# Patient Record
Sex: Male | Born: 1935 | Race: Black or African American | Hispanic: No | Marital: Single
Health system: Southern US, Community
[De-identification: ages and names within clinical notes are randomized; demographics above are authoritative.]

---

## 2003-09-16 ENCOUNTER — Other Ambulatory Visit: Payer: Self-pay

## 2007-01-10 ENCOUNTER — Inpatient Hospital Stay: Payer: Self-pay | Admitting: General Surgery

## 2007-01-10 ENCOUNTER — Other Ambulatory Visit: Payer: Self-pay

## 2007-02-20 ENCOUNTER — Inpatient Hospital Stay: Payer: Self-pay | Admitting: General Surgery

## 2007-02-20 ENCOUNTER — Other Ambulatory Visit: Payer: Self-pay

## 2007-02-26 ENCOUNTER — Other Ambulatory Visit: Payer: Self-pay

## 2007-09-01 IMAGING — CR DG CHEST 1V
1 series · 1 of 1 positions shown · non-contrast
Comparison: none

REASON FOR EXAM: CHEST PAIN
COMMENTS:

[view not recorded]
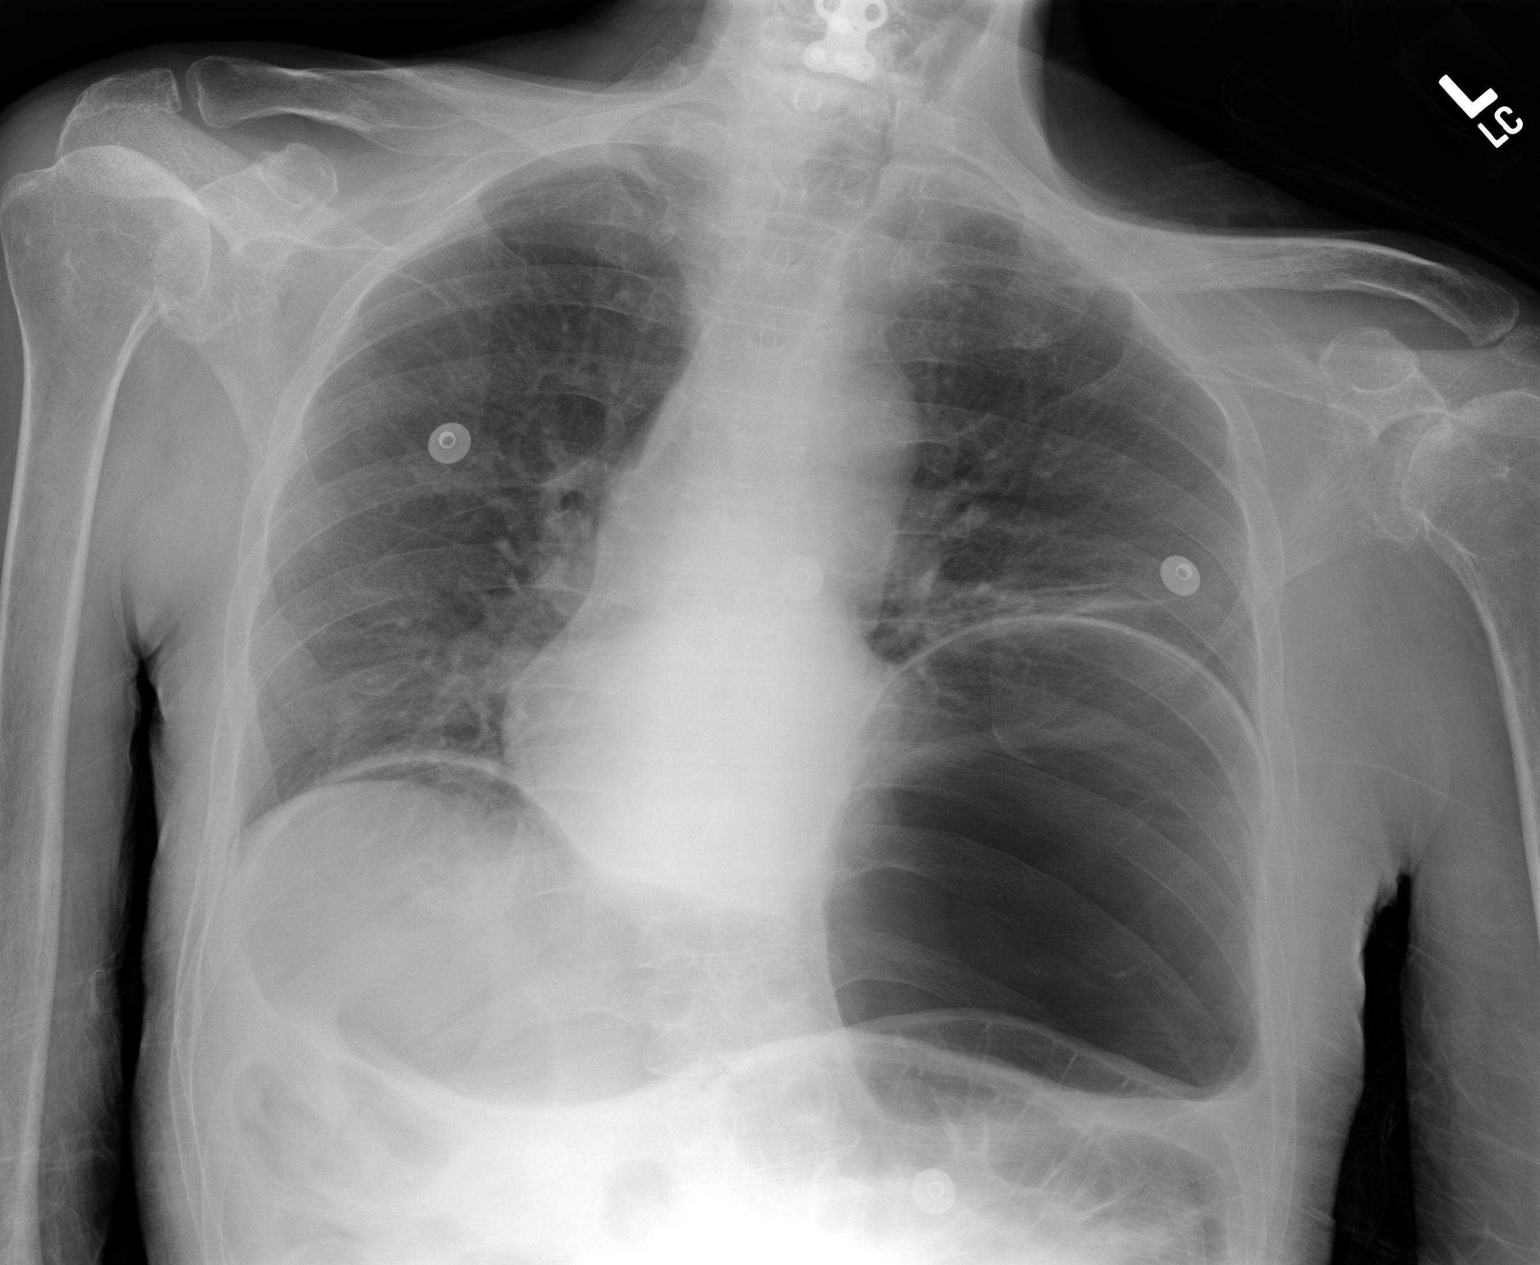

[1 of 1 positions shown; findings below may reference images not displayed]

PROCEDURE:     DXR - DXR CHEST 1 VIEWAP OR PA  - February 20, 2007 [DATE]

RESULT:       Comparison is made to a prior study dated 01/27/07.

The patient has taken a shallow inspiration. There is marked dilation of the
stomach which appears to be air-filled. Air-filled loops of large and small
bowel are appreciated. When compared to a previous study dated 01/27/2007,
these findings were appreciated at that time. No new focal regions of
consolidation are appreciated. The LEFT sided central venous catheter has
been removed in the interim.
IMPRESSION: Shallow inspiration with air-filled dilated stomach and distended air-filled
loops of large and small bowel. No new focal regions of consolidation are
appreciated. An area of increased density projects within the LEFT lung base
not mentioned above which has a linear pattern and likely represents
vascular crowding due to the dilated stomach. Surveillance evaluation
recommended if and as clinically warranted considering the patient's
history.

## 2007-09-01 IMAGING — CR DG ABDOMEN 2V
1 series · 3 of 3 positions shown · non-contrast
Comparison: none

REASON FOR EXAM: abd pain
COMMENTS:

[Series 1: view not recorded · 0.17mm/px · 3 of 3 slices shown]
[im 1/3]
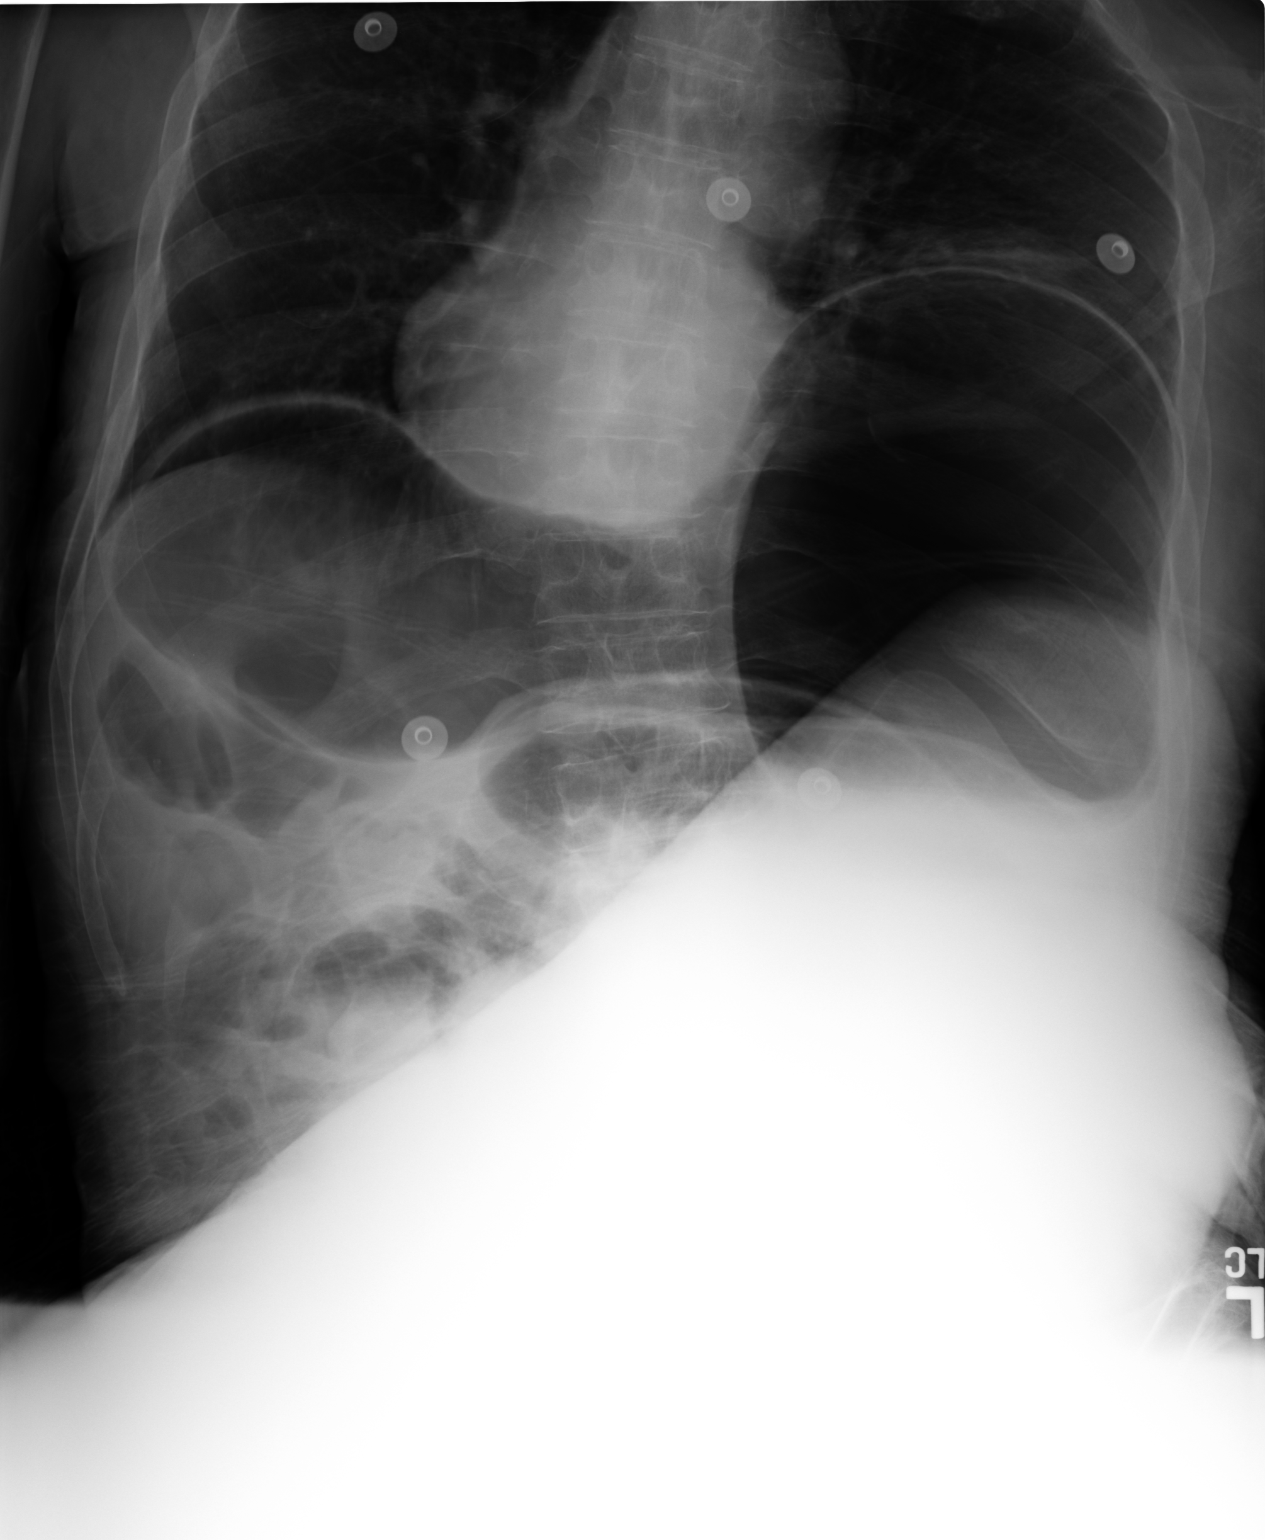
[im 2/3]
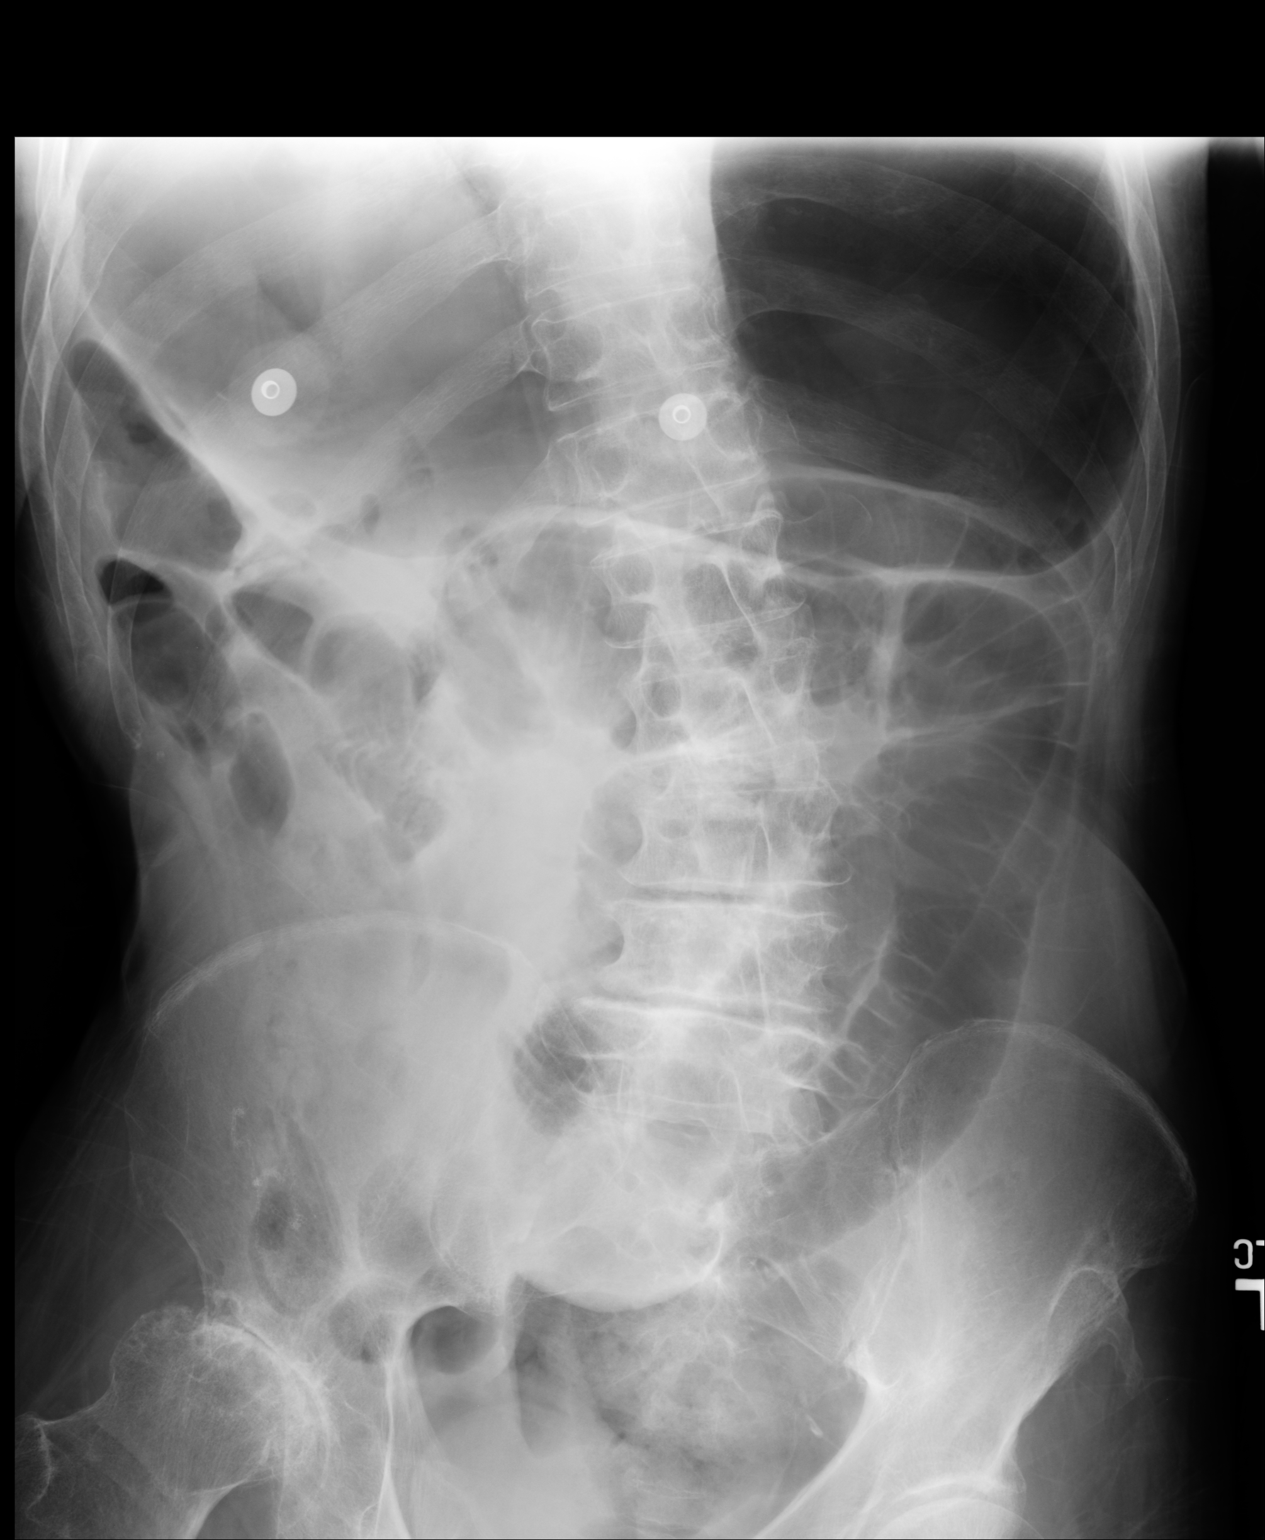
[im 3/3]
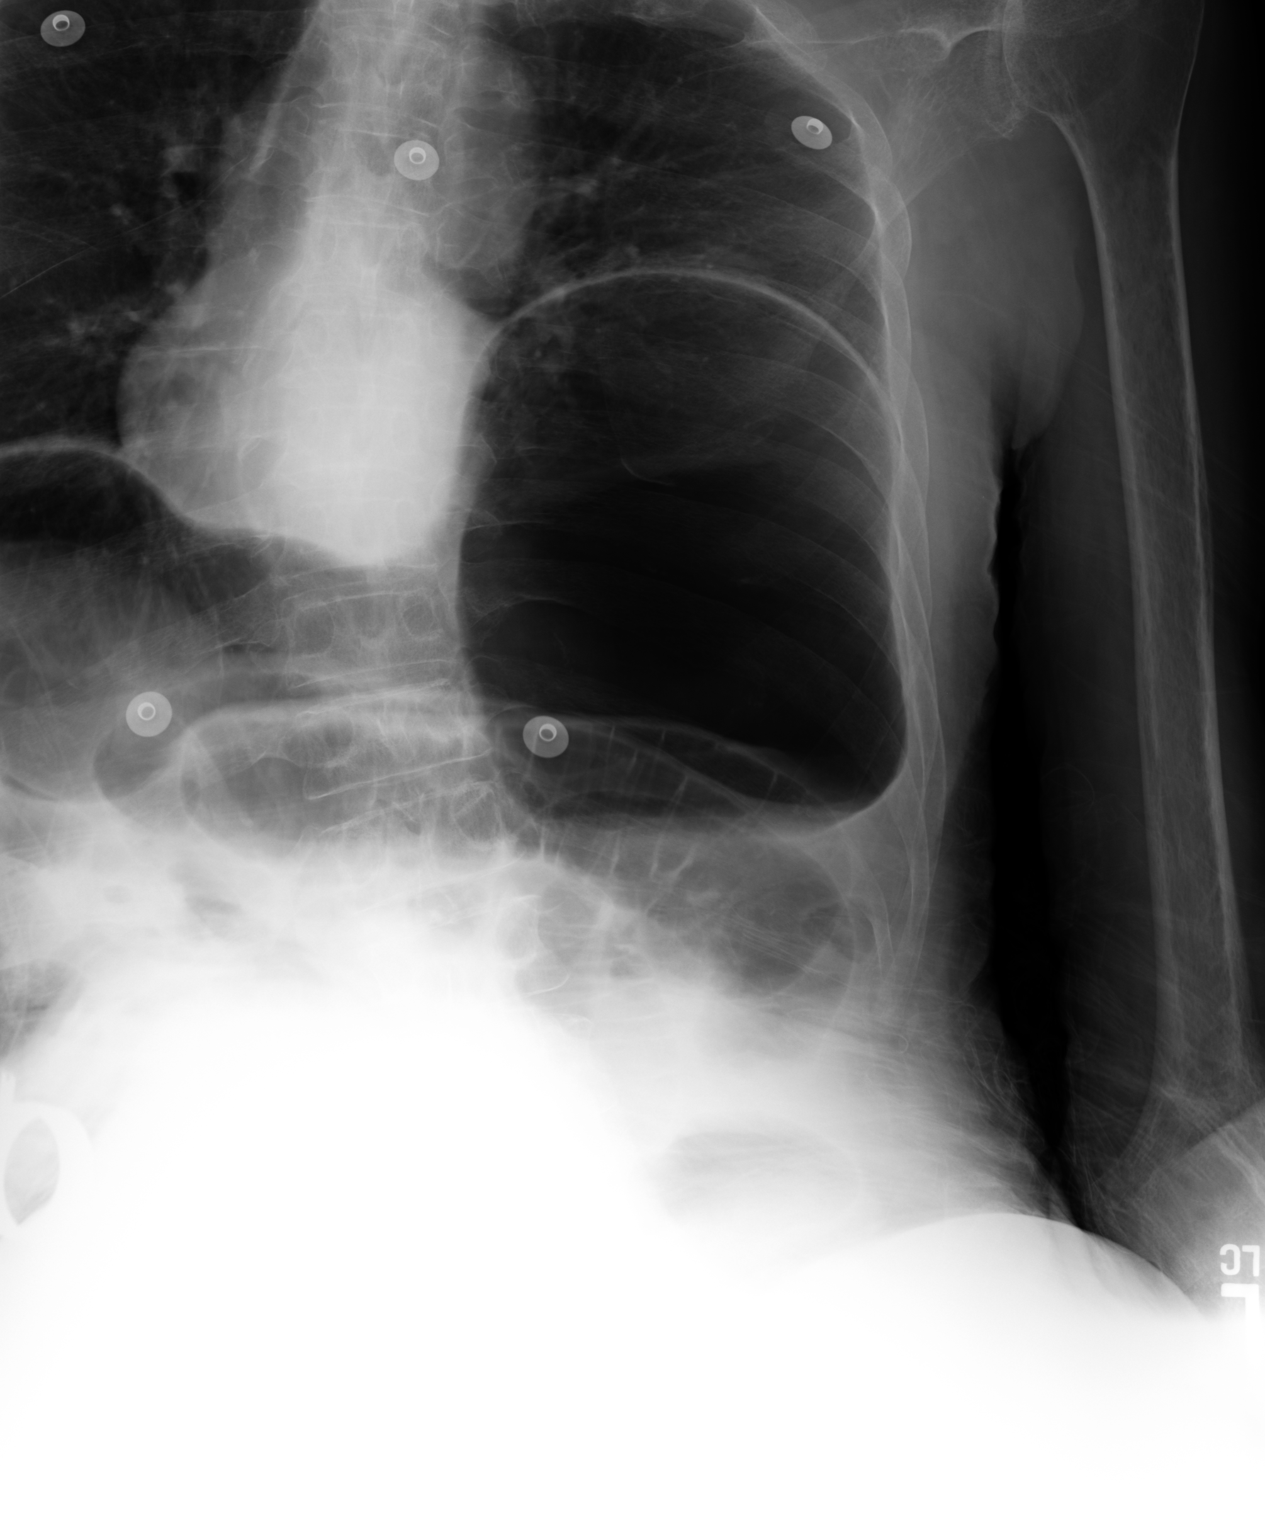

[3 of 3 positions shown; findings below may reference images not displayed]

PROCEDURE:     DXR - DXR ABDOMEN 2 V FLAT AND ERECT  - February 20, 2007 [DATE]

RESULT:     The upright view is obscured.

Dilated loops of small bowel are appreciated as well as a dilated air-filled
stomach. Air is appreciated within loops of large bowel. The visualized bony
skeleton is osteopenic. The thoracolumbar spine demonstrates levoscoliosis.
Degenerative changes are appreciated involving the RIGHT and LEFT hips.
IMPRESSION: Findings which appear to reflect the sequela of a small bowel obstruction
versus an ileus. This may be an early or partial obstruction though
considering the findings in the stomach an ileus is a diagnostic
consideration. Surveillance evaluation is recommended.

## 2007-09-05 IMAGING — CR DG ABDOMEN 2V
1 series · 2 of 2 positions shown · non-contrast
Comparison: none

REASON FOR EXAM: F/U abdominal distension.
COMMENTS:

[Series 1: view not recorded · 0.17mm/px · 2 of 2 slices shown]
[im 1/2]
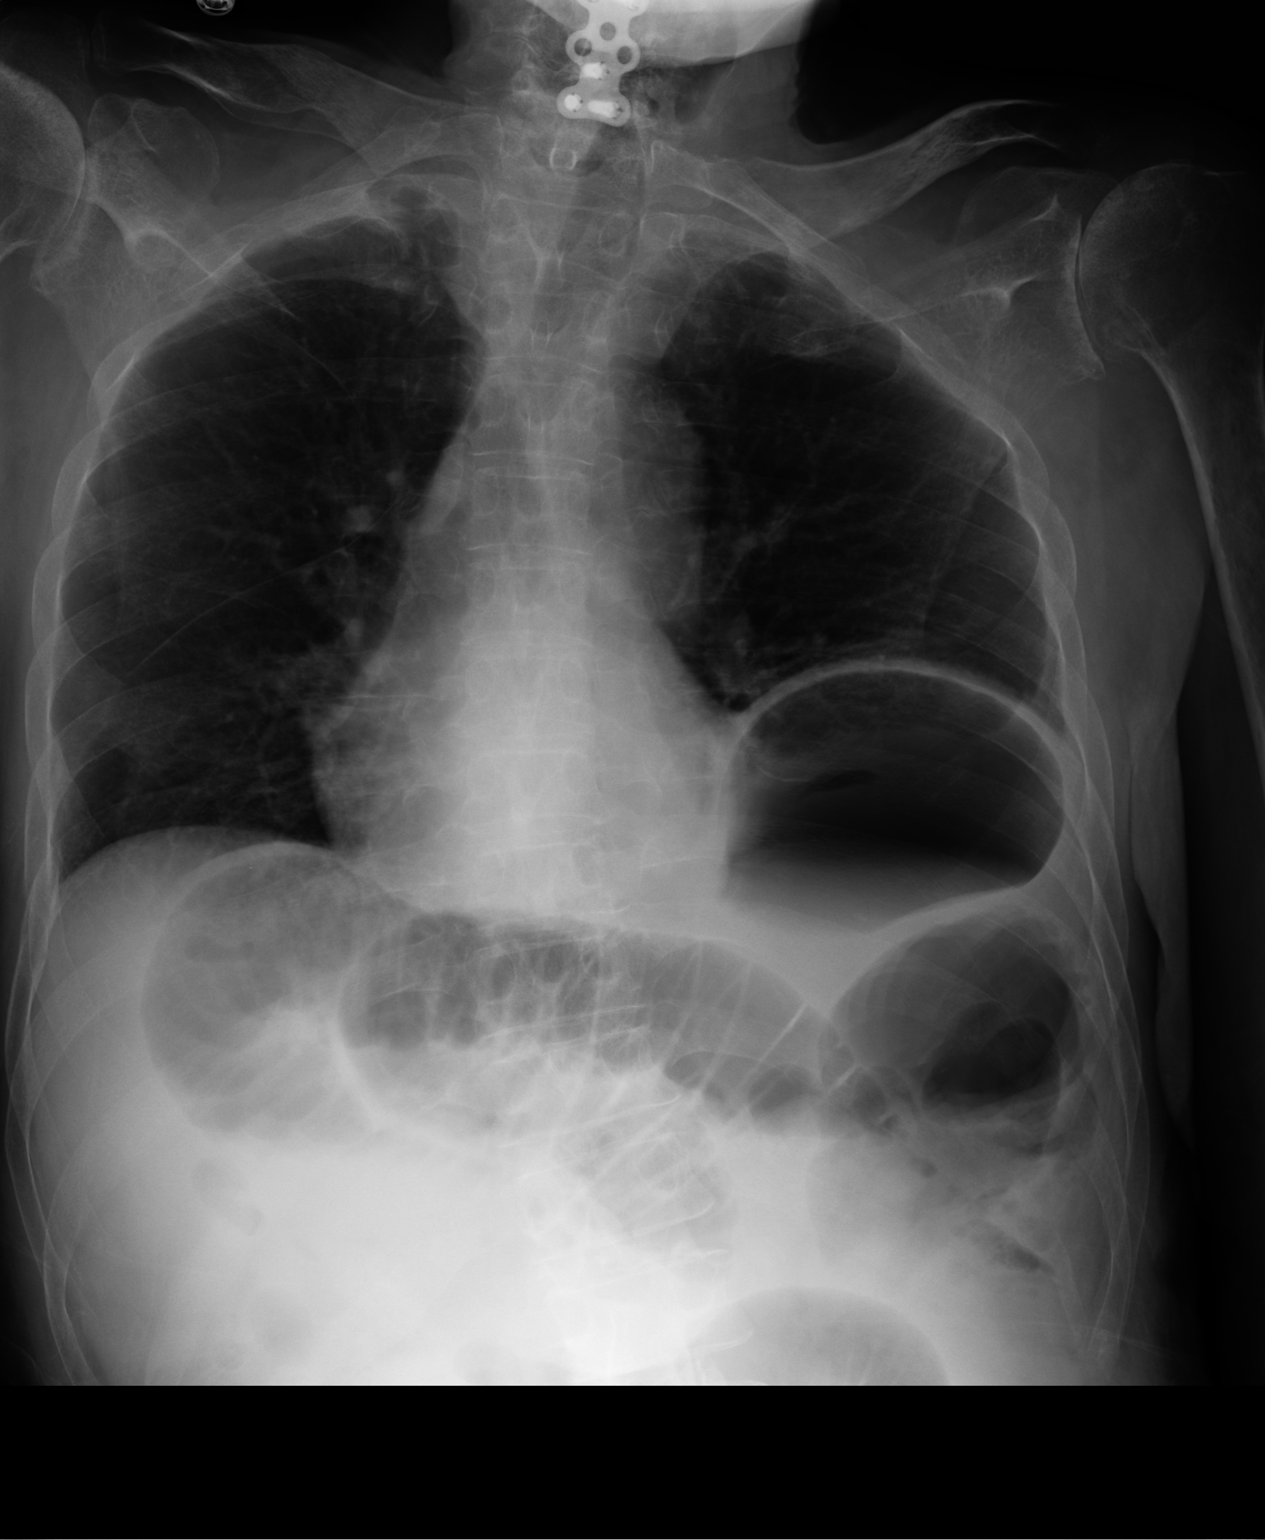
[im 2/2]
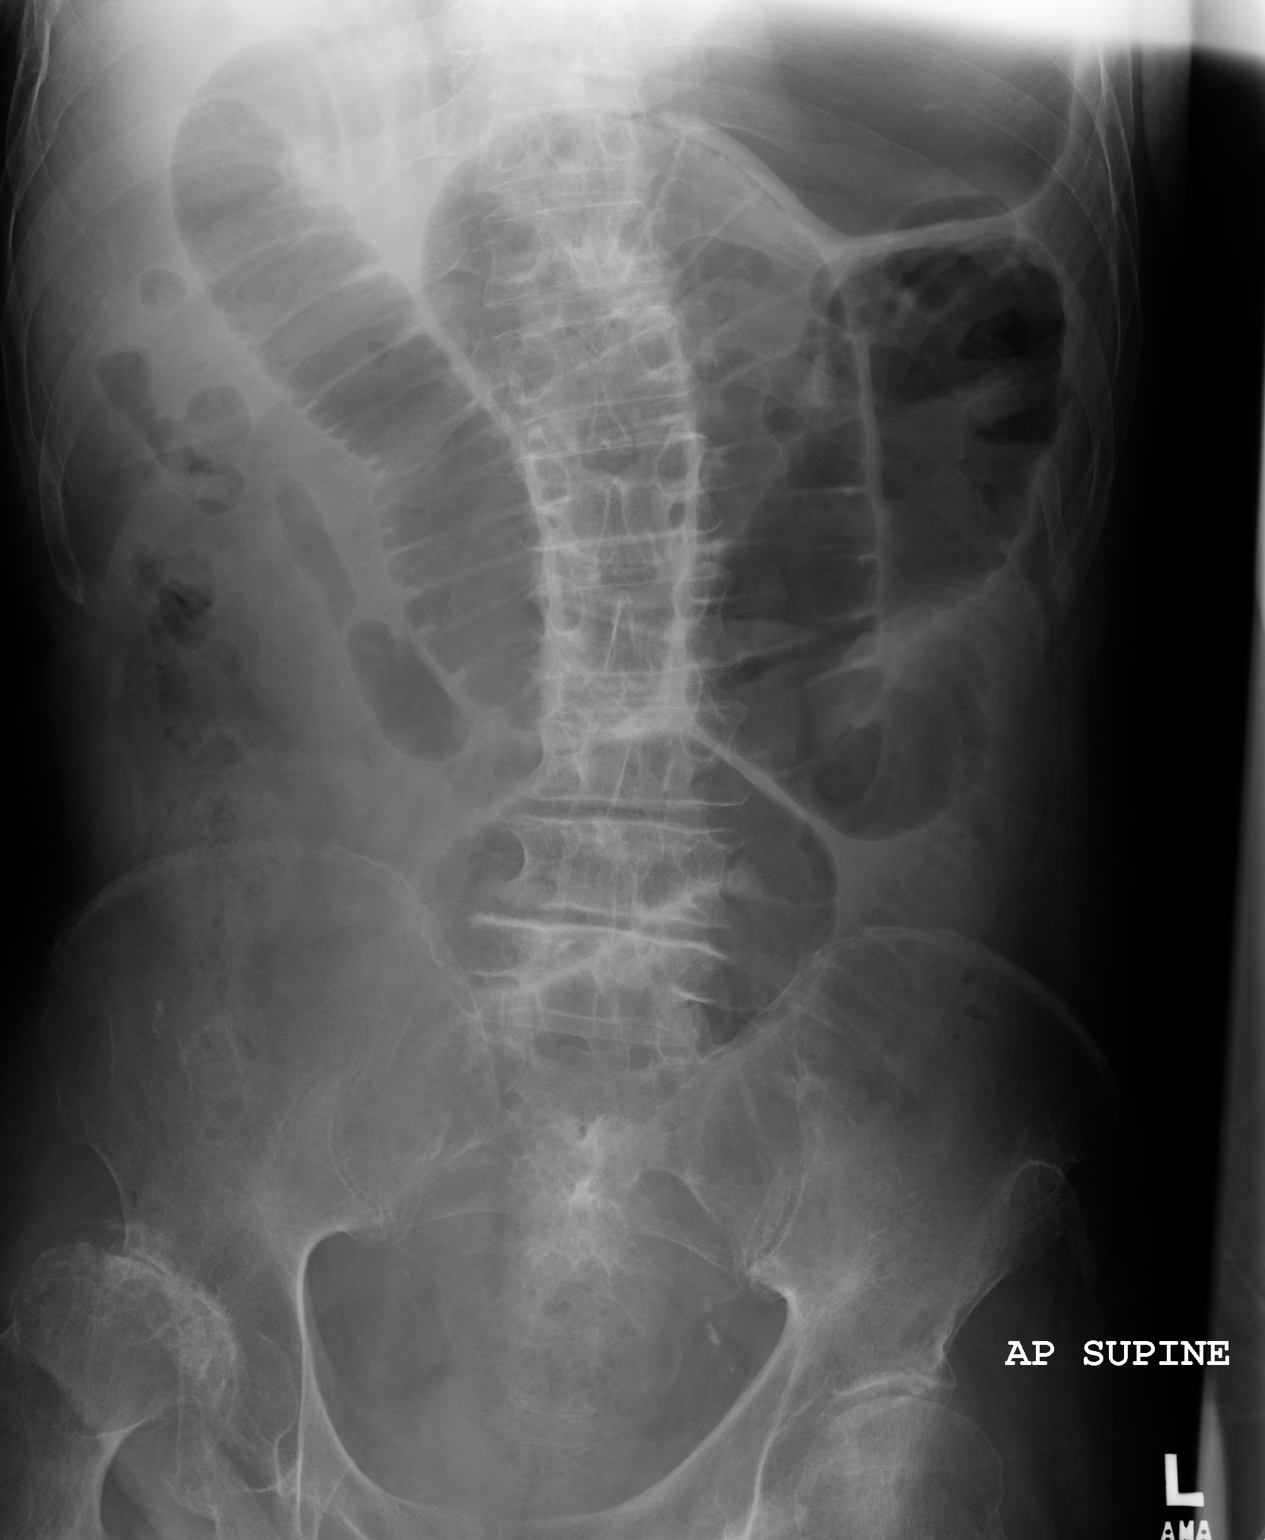

[2 of 2 positions shown; findings below may reference images not displayed]

PROCEDURE:     DXR - DXR ABDOMEN 2 V FLAT AND ERECT  - February 24, 2007  [DATE]

RESULT:     Comparison made to a prior exam of 02/19/2006. There is again
noted dilatation of multiple loops of small bowel with air-fluid levels
noted in the erect view. There is a small amount of gas seen in the colon
but the colon gas is relatively decreased. The findings remain suspicious
for a partial small bowel obstruction. No abnormal intraabdominal
calcifications are identified. Severe arthritic change is present at the
RIGHT hip.
IMPRESSION: 1.     There persist multiple dilated loops of small bowel suspicious for
partial small bowel obstruction. Continued follow-up is recommended.

## 2007-09-07 IMAGING — CR DG CHEST 1V PORT
1 series · 1 of 1 positions shown · non-contrast
Comparison: none

REASON FOR EXAM: post op SBO, please complete erect
COMMENTS:

PROCEDURE:     DXR - DXR PORTABLE CHEST SINGLE VIEW  - February 26, 2007  [DATE]
RESULT:     Comparison: 02/20/2007.

[view not recorded]
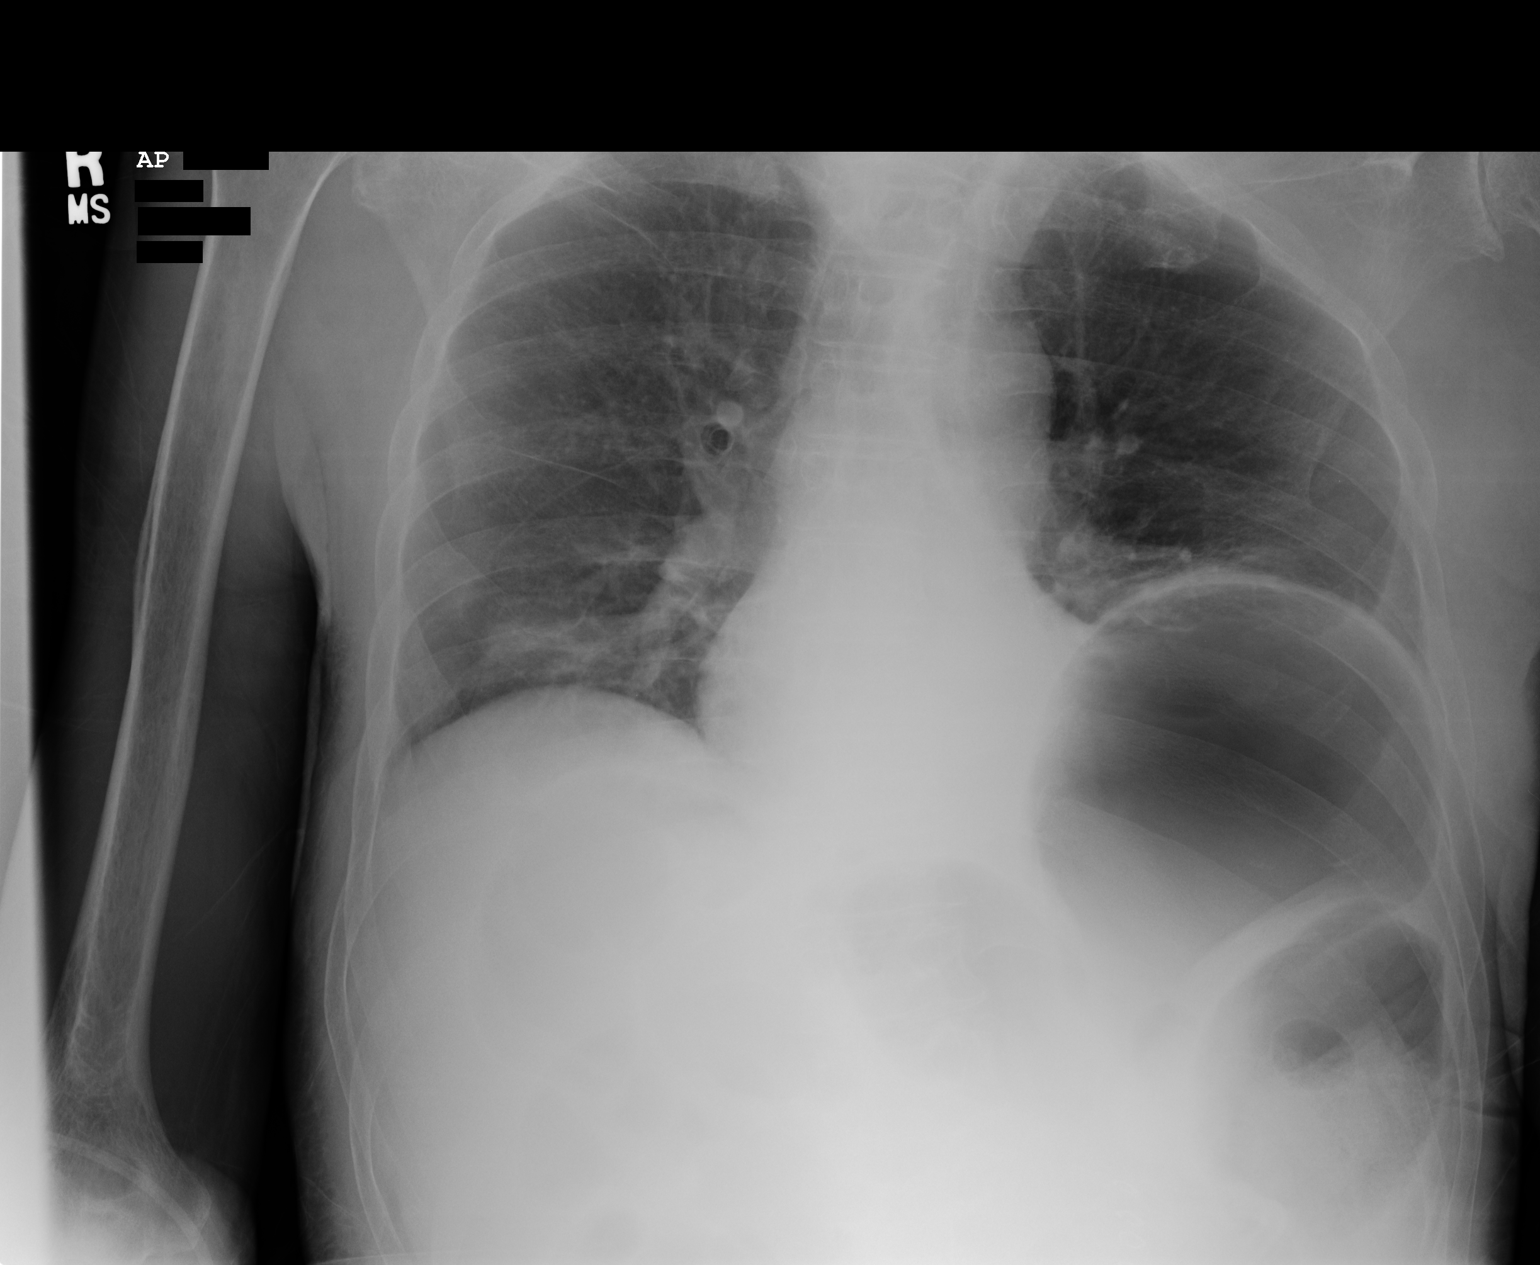

[1 of 1 positions shown; findings below may reference images not displayed]

FINDINGS: Low lung volumes. Mild left bibasilar atelectasis. Mild new right basilar
opacity may be due to atelectasis or pneumonia. No significant pulmonary
edema, pleural effusion, nor pneumothorax. The cardiomediastinal silhouette
is stable.

Limited visualization of the upper abdomen again shows distention of the
stomach and several loops of bowel.
IMPRESSION: 1. Mild new bibasilar opacity may be due to atelectasis or pneumonia.
Correlate clinically.
2. Distention of the stomach and several loops of bowel may be due to
postoperative ileus.

## 2007-09-12 IMAGING — CR DG ABDOMEN 2V
1 series · 2 of 2 positions shown · non-contrast
Comparison: none

REASON FOR EXAM: Follow-up small bowel obstruction
COMMENTS:

[Series 1: view not recorded · 0.17mm/px · 2 of 2 slices shown]
[im 1/2]
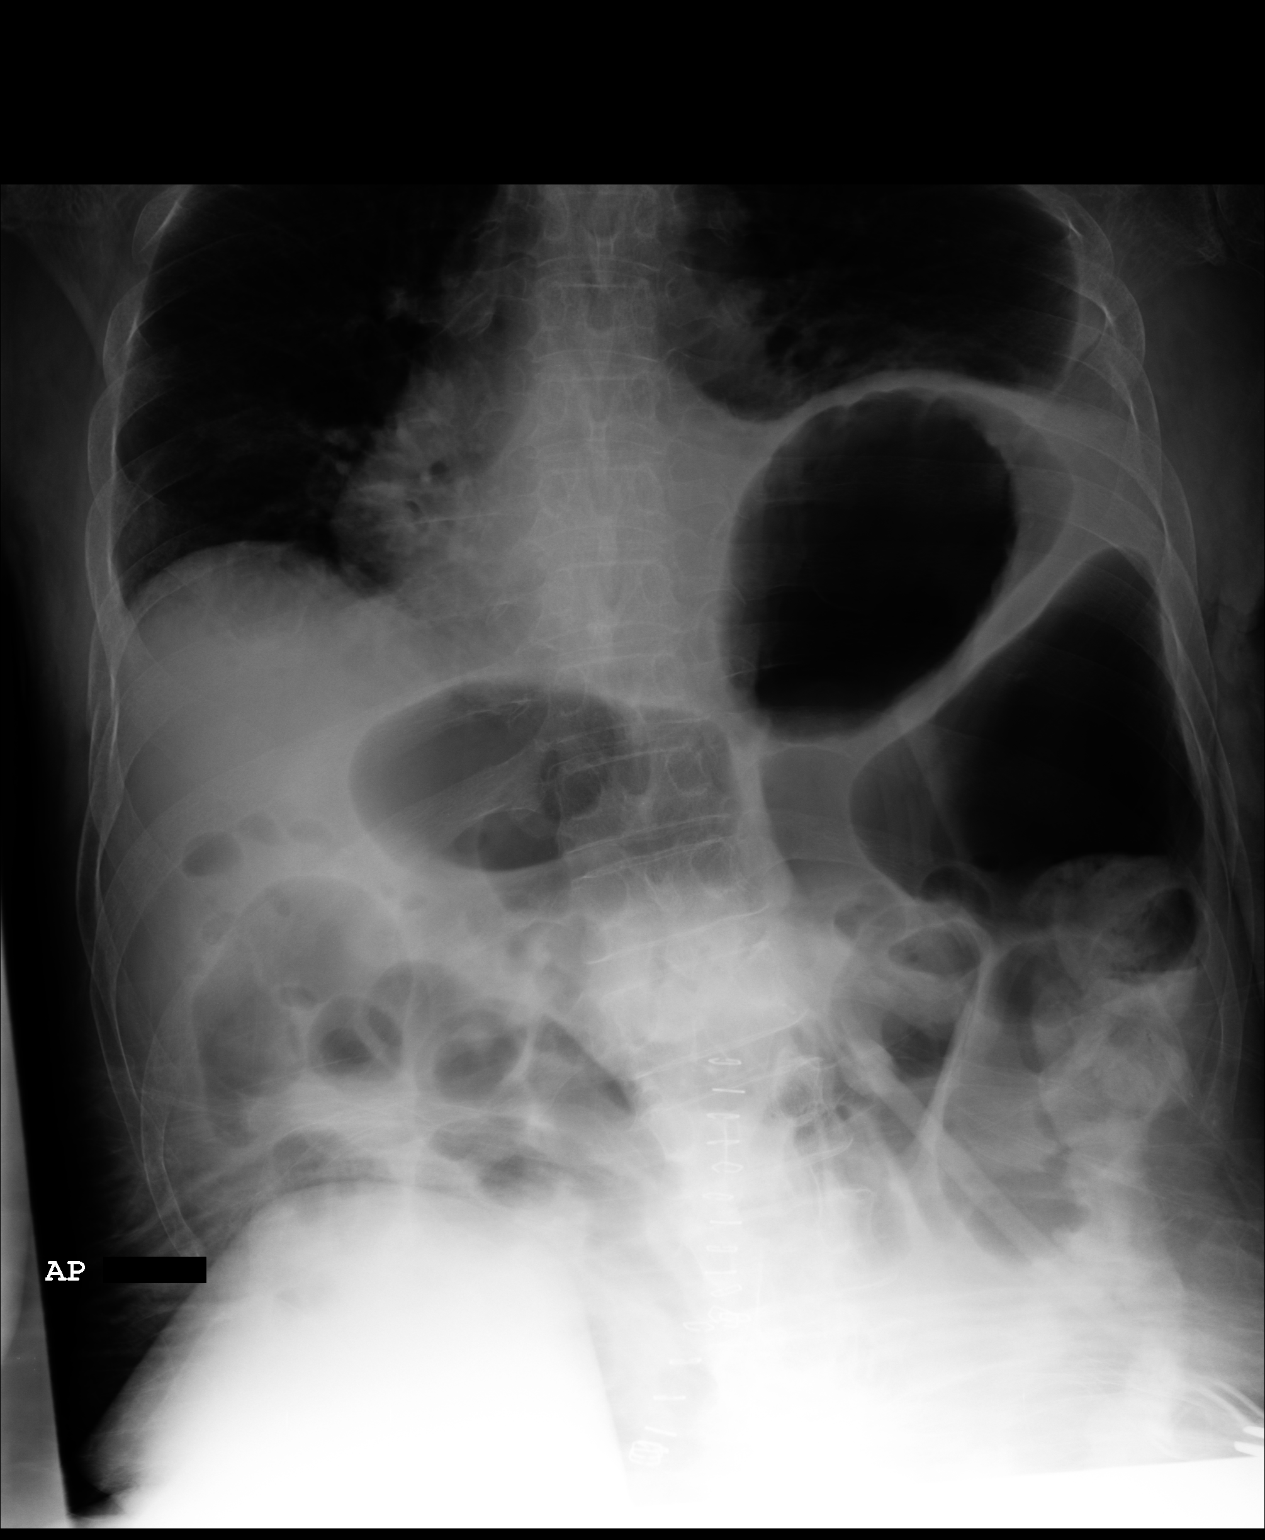
[im 2/2]
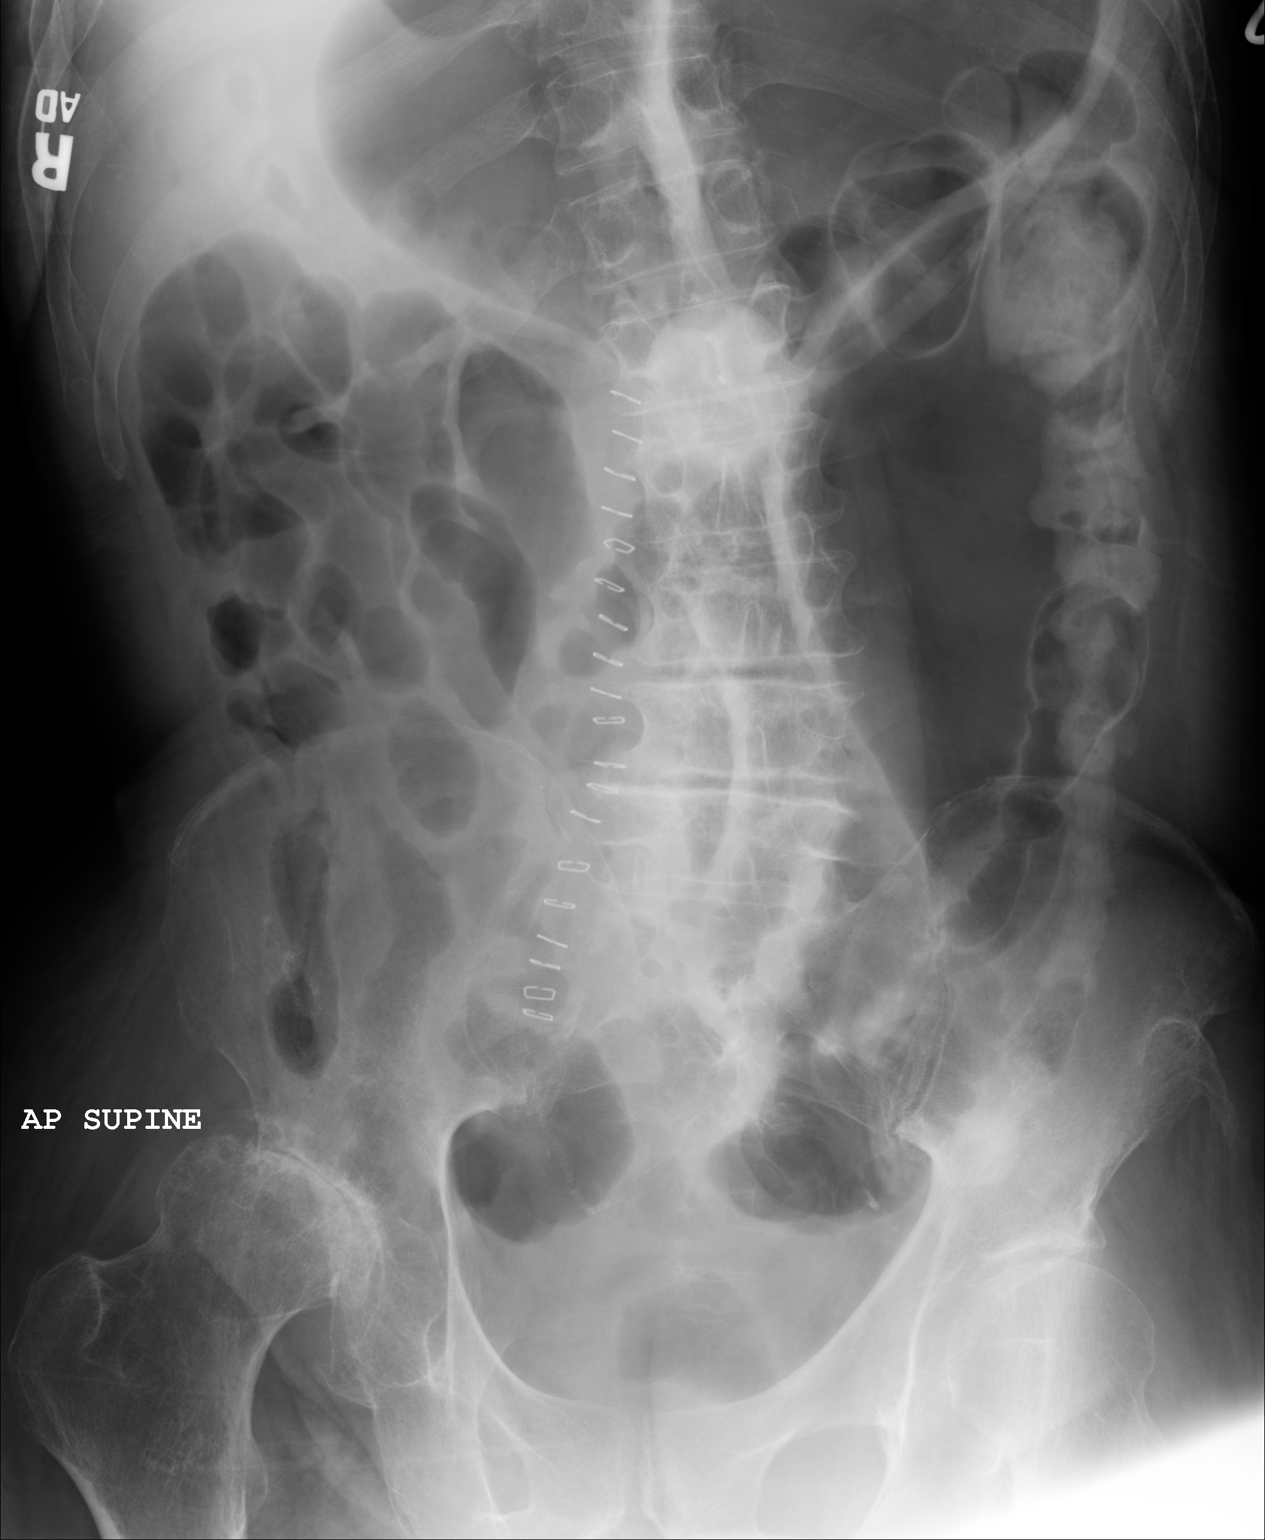

[2 of 2 positions shown; findings below may reference images not displayed]

PROCEDURE:     DXR - DXR ABDOMEN 2 V FLAT AND ERECT  - March 03, 2007 [DATE]

RESULT:     Comparison is made to a study of 02/24/2007.

The stomach is distended with gas. There are loops of moderately distended
gas filled bowel present. A dominant gas collection on the LEFT is of
uncertain etiology. There is gas and stool in what appears to be the
descending colon. There are surgical clips in the midline. There are
degenerative changes with scoliosis of the thoracolumbar spine.
IMPRESSION: There has been improvement in the appearance of the small
bowel obstruction since the prior study but considerable gas within
distended bowel is present. This is located in the mid to upper abdomen.
Yet, there does appear to be a normal appearing descending colon present.
The possibility of there being distension of the transverse colon or
extralumenal gas is raised. It is simply not possible to better characterize
the exact location of this gas. CT scanning would be a useful next step.

## 2008-08-29 ENCOUNTER — Emergency Department: Payer: Self-pay | Admitting: Emergency Medicine

## 2011-06-30 ENCOUNTER — Inpatient Hospital Stay: Payer: Self-pay | Admitting: Internal Medicine

## 2011-08-07 ENCOUNTER — Ambulatory Visit: Payer: Self-pay | Admitting: Internal Medicine

## 2011-08-07 ENCOUNTER — Ambulatory Visit: Payer: Self-pay | Admitting: Oncology

## 2011-08-14 LAB — COMPREHENSIVE METABOLIC PANEL
Albumin: 3 g/dL — ABNORMAL LOW (ref 3.4–5.0)
Anion Gap: 12 (ref 7–16)
BUN: 23 mg/dL — ABNORMAL HIGH (ref 7–18)
Bilirubin,Total: 0.4 mg/dL (ref 0.2–1.0)
Glucose: 82 mg/dL (ref 65–99)
Osmolality: 304 (ref 275–301)
Potassium: 3.7 mmol/L (ref 3.5–5.1)
Sodium: 152 mmol/L — ABNORMAL HIGH (ref 136–145)
Total Protein: 6.7 g/dL (ref 6.4–8.2)

## 2011-08-14 LAB — CBC
HCT: 37.5 % — ABNORMAL LOW (ref 40.0–52.0)
HGB: 12.1 g/dL — ABNORMAL LOW (ref 13.0–18.0)
MCV: 97 fL (ref 80–100)
RBC: 3.87 10*6/uL — ABNORMAL LOW (ref 4.40–5.90)
WBC: 7.4 10*3/uL (ref 3.8–10.6)

## 2011-08-14 LAB — URINALYSIS, COMPLETE
Bilirubin,UR: NEGATIVE
Blood: NEGATIVE
Glucose,UR: NEGATIVE mg/dL (ref 0–75)
Ketone: NEGATIVE
Leukocyte Esterase: NEGATIVE
Squamous Epithelial: NONE SEEN

## 2011-08-15 ENCOUNTER — Inpatient Hospital Stay: Payer: Self-pay | Admitting: Internal Medicine

## 2011-08-16 LAB — PROT IMMUNOELECTROPHORES(ARMC)

## 2011-08-16 LAB — BASIC METABOLIC PANEL
Anion Gap: 9 (ref 7–16)
BUN: 17 mg/dL (ref 7–18)
Chloride: 115 mmol/L — ABNORMAL HIGH (ref 98–107)
Co2: 24 mmol/L (ref 21–32)
Creatinine: 0.84 mg/dL (ref 0.60–1.30)
EGFR (African American): >60
EGFR (Non-African Amer.): >60
Glucose: 111 mg/dL — ABNORMAL HIGH (ref 65–99)
Potassium: 3.9 mmol/L (ref 3.5–5.1)
Sodium: 148 mmol/L — ABNORMAL HIGH (ref 136–145)

## 2011-08-16 LAB — CBC WITH DIFFERENTIAL/PLATELET
Basophil %: 0.6 %
Eosinophil %: 4.4 %
HCT: 34.6 % — ABNORMAL LOW (ref 40.0–52.0)
HGB: 11.3 g/dL — ABNORMAL LOW (ref 13.0–18.0)
Lymphocyte #: 1.4 10*3/uL (ref 1.0–3.6)
MCV: 97 fL (ref 80–100)
Monocyte %: 6.7 %
Neutrophil #: 4.6 10*3/uL (ref 1.4–6.5)
RBC: 3.56 10*6/uL — ABNORMAL LOW (ref 4.40–5.90)
WBC: 6.7 10*3/uL (ref 3.8–10.6)

## 2011-08-17 LAB — BASIC METABOLIC PANEL
BUN: 11 mg/dL (ref 7–18)
Calcium, Total: 7.9 mg/dL — ABNORMAL LOW (ref 8.5–10.1)
Chloride: 112 mmol/L — ABNORMAL HIGH (ref 98–107)
Co2: 24 mmol/L (ref 21–32)
Creatinine: 0.8 mg/dL (ref 0.60–1.30)
EGFR (African American): >60
Glucose: 87 mg/dL (ref 65–99)
Osmolality: 289 (ref 275–301)
Potassium: 3.7 mmol/L (ref 3.5–5.1)
Sodium: 146 mmol/L — ABNORMAL HIGH (ref 136–145)

## 2011-08-19 LAB — BASIC METABOLIC PANEL
Calcium, Total: 7.9 mg/dL — ABNORMAL LOW (ref 8.5–10.1)
Chloride: 107 mmol/L (ref 98–107)
Co2: 27 mmol/L (ref 21–32)
EGFR (African American): >60
EGFR (Non-African Amer.): >60
Glucose: 70 mg/dL (ref 65–99)

## 2011-08-19 LAB — IRON AND TIBC
Iron Bind.Cap.(Total): 161 ug/dL — ABNORMAL LOW (ref 250–450)
Iron Saturation: 46 %
Iron: 74 ug/dL (ref 65–175)
Unbound Iron-Bind.Cap.: 87 ug/dL

## 2011-08-20 ENCOUNTER — Ambulatory Visit: Payer: Self-pay | Admitting: Oncology

## 2011-08-20 LAB — URINE IEP, RANDOM

## 2011-08-21 LAB — KAPPA/LAMBDA FREE LIGHT CHAINS (ARMC)

## 2011-09-07 ENCOUNTER — Ambulatory Visit: Payer: Self-pay | Admitting: Internal Medicine

## 2011-09-07 ENCOUNTER — Ambulatory Visit: Payer: Self-pay | Admitting: Oncology

## 2011-09-14 ENCOUNTER — Inpatient Hospital Stay: Payer: Self-pay | Admitting: Internal Medicine

## 2011-09-14 LAB — COMPREHENSIVE METABOLIC PANEL
Albumin: 2.8 g/dL — ABNORMAL LOW (ref 3.4–5.0)
Anion Gap: 10 (ref 7–16)
Calcium, Total: 9 mg/dL (ref 8.5–10.1)
Chloride: 116 mmol/L — ABNORMAL HIGH (ref 98–107)
Co2: 29 mmol/L (ref 21–32)
EGFR (African American): >60
Potassium: 3.4 mmol/L — ABNORMAL LOW (ref 3.5–5.1)
SGOT(AST): 67 U/L — ABNORMAL HIGH (ref 15–37)
SGPT (ALT): 59 U/L
Sodium: 155 mmol/L — ABNORMAL HIGH (ref 136–145)

## 2011-09-14 LAB — CBC
HCT: 38.9 % — ABNORMAL LOW (ref 40.0–52.0)
HGB: 12.6 g/dL — ABNORMAL LOW (ref 13.0–18.0)
MCH: 32.4 pg (ref 26.0–34.0)
MCHC: 32.3 g/dL (ref 32.0–36.0)
MCV: 100 fL (ref 80–100)
Platelet: 203 10*3/uL (ref 150–440)
RBC: 3.88 10*6/uL — ABNORMAL LOW (ref 4.40–5.90)

## 2011-09-14 LAB — TROPONIN I: Troponin-I: <0.02 ng/mL

## 2011-09-14 LAB — URINALYSIS, COMPLETE
Glucose,UR: NEGATIVE mg/dL (ref 0–75)
Leukocyte Esterase: NEGATIVE
Nitrite: POSITIVE
Ph: 5 (ref 4.5–8.0)
Protein: NEGATIVE
Specific Gravity: 1.024 (ref 1.003–1.030)

## 2011-09-14 LAB — CK TOTAL AND CKMB (NOT AT ARMC): CK-MB: 0.5 ng/mL (ref 0.5–3.6)

## 2011-09-15 LAB — COMPREHENSIVE METABOLIC PANEL
Albumin: 2 g/dL — ABNORMAL LOW (ref 3.4–5.0)
Alkaline Phosphatase: 85 U/L (ref 50–136)
BUN: 23 mg/dL — ABNORMAL HIGH (ref 7–18)
Bilirubin,Total: 0.4 mg/dL (ref 0.2–1.0)
Co2: 30 mmol/L (ref 21–32)
Creatinine: 1.02 mg/dL (ref 0.60–1.30)
Potassium: 3.9 mmol/L (ref 3.5–5.1)
SGPT (ALT): 40 U/L
Total Protein: 5.8 g/dL — ABNORMAL LOW (ref 6.4–8.2)

## 2011-09-15 LAB — CBC WITH DIFFERENTIAL/PLATELET
Basophil #: 0 10*3/uL (ref 0.0–0.1)
Eosinophil #: 0.1 10*3/uL (ref 0.0–0.7)
Eosinophil %: 1.2 %
HCT: 30.1 % — ABNORMAL LOW (ref 40.0–52.0)
Lymphocyte #: 1 10*3/uL (ref 1.0–3.6)
Lymphocyte %: 12.7 %
MCHC: 33 g/dL (ref 32.0–36.0)
MCV: 104 fL — ABNORMAL HIGH (ref 80–100)
Monocyte #: 1 10*3/uL — ABNORMAL HIGH (ref 0.0–0.7)
Monocyte %: 12 %
Neutrophil #: 5.9 10*3/uL (ref 1.4–6.5)
Neutrophil %: 73.6 %
Platelet: 177 10*3/uL (ref 150–440)
RBC: 2.9 10*6/uL — ABNORMAL LOW (ref 4.40–5.90)
RDW: 17.5 % — ABNORMAL HIGH (ref 11.5–14.5)
WBC: 8.1 10*3/uL (ref 3.8–10.6)

## 2011-09-15 LAB — MAGNESIUM: Magnesium: 1.5 mg/dL — ABNORMAL LOW

## 2011-09-15 LAB — PROTIME-INR
INR: 1.2
Prothrombin Time: 15.9 secs — ABNORMAL HIGH (ref 11.5–14.7)

## 2011-09-16 LAB — BASIC METABOLIC PANEL
Anion Gap: 6 — ABNORMAL LOW (ref 7–16)
Calcium, Total: 8.1 mg/dL — ABNORMAL LOW (ref 8.5–10.1)
Chloride: 109 mmol/L — ABNORMAL HIGH (ref 98–107)
Co2: 27 mmol/L (ref 21–32)
Creatinine: 0.91 mg/dL (ref 0.60–1.30)
EGFR (African American): >60
Osmolality: 283 (ref 275–301)
Potassium: 4 mmol/L (ref 3.5–5.1)

## 2011-09-16 LAB — MAGNESIUM: Magnesium: 2.2 mg/dL

## 2011-09-18 LAB — BASIC METABOLIC PANEL
Anion Gap: 10 (ref 7–16)
BUN: 10 mg/dL (ref 7–18)
Chloride: 103 mmol/L (ref 98–107)
Co2: 25 mmol/L (ref 21–32)
Creatinine: 0.73 mg/dL (ref 0.60–1.30)
EGFR (African American): >60

## 2011-09-20 LAB — CULTURE, BLOOD (SINGLE)

## 2011-10-04 ENCOUNTER — Ambulatory Visit: Payer: Self-pay | Admitting: Oncology

## 2012-01-05 DEATH — deceased

## 2014-11-28 NOTE — Consult Note (Signed)
PATIENT NAME:  Troy Cervantes, Troy Cervantes MR#:  [id number] DATE OF BIRTH:  Apr 06, 1933  DATE OF CONSULTATION:  09/15/2011  CONSULTING PHYSICIAN:  Christena Deem, MD  HISTORY OF PRESENT ILLNESS: Troy Cervantes is a 81 year old African American male with a history of cerebral palsy. Troy Cervantes was brought to the Emergency Room with a complaint of failure to thrive and dehydration. Troy Cervantes has a past medical history of cervical disk disease, myelopathy, functional quadriplegia, cerebral palsy, possibly Alzheimer's dementia. I have been asked to see the patient in regards to some problems apparently with nausea, failure to thrive, and possibly dysphagia. Troy Cervantes was in the hospital about a month ago, at that time for a similar presentation. According to his caretaker/aunt, his appetite has been poor and Troy Cervantes has been losing some weight. It is very difficult to obtain any history from Troy Cervantes due to his comorbidities. Troy Cervantes did have a barium swallow during his hospitalization last month that was interpreted as difficulties with the barium column going down into the stomach due mainly to positioning, not due to actual stricturing or other lesion.   PAST MEDICAL/SURGICAL HISTORY:  1. Alzheimer's dementia. 2. Cerebral palsy. 3. Chronic obstructive pulmonary disease. 4. 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 5. Degenerative disk disease. 6. Pituitary tumor resection x2.  7. History of a left hip lytic lesion. 8. Hypernatremia. 9. Dysphagia.   OUTPATIENT MEDICATIONS:  1. Lomotil. 2. Baclofen 10 mg every 8 hours p.r.n.  3. Celebrex 100 mg b.i.d.   4. Cyanocobalamin 500 mcg once a day. 5. Diazepam 5 mg daily. 6. Docusate sodium 100 mg orally daily. 7. Donepezil 10 mg once a day. 8. Dulcolax 5 mg, 2 tablets once a day. 9. Hydroxyzine 25 mg a day.  ALLERGIES: Troy Cervantes is allergic to codeine, gabapentin, intravenous pyelogram dye, and Tylenol.   REVIEW OF SYSTEMS: Unable to obtain from  patient.   PHYSICAL EXAMINATION:  VITAL SIGNS: Temperature is 98.7, pulse 88, respirations 20, blood pressure 99/62, pulse oximetry 97%.   GENERAL: Troy Cervantes is an elderly African American male in no acute distress. Troy Cervantes is contracted and somewhat sideways in the bed. Troy Cervantes does respond to questions, and it is possible to make intelligible replies; however, Troy Cervantes is not able to give true detailed verbiage.   HEENT: Head: Normocephalic, atraumatic. Eyes: Anicteric. Oropharynx: Poor dentition.   NECK: No JVD.   HEART: Regular rate and rhythm.   LUNGS: Bilaterally clear.   ABDOMEN: Soft, nontender, and nondistended. Bowel sounds are positive, normoactive.   RECTAL: Anorectal exam is deferred.   EXTREMITIES: No clubbing or cyanosis or edema.   NEUROLOGICAL: Cranial nerves seem to be intact. Troy Cervantes is in a contracted state.   LABORATORY, DIAGNOSTIC AND RADIOLOGICAL DATA:  On admission to the hospital, Troy Cervantes had a glucose of 111, BUN 23, creatinine 1.08, sodium 155, potassium 3.4, chloride 116, bicarbonate 29, calcium 9.0.  Hepatic profile showing a low albumin at 2.8, protein, however, was 7.6. Total bilirubin 0.5, alkaline phosphatase 109, AST 67, ALT 59.  Cardiac profile: There was only one set of cardiac enzymes done, these normal.  Hemogram on admission showed a white count of 10.1, hemoglobin and hematocrit of 12.6/38.9, platelet count of 203. MCV 100.  Urinalysis showed 3+ bacteria, 1+ blood, negative leukocyte esterase, positive nitrite.  Troy Cervantes had a 3-way abdominal film showing possible fecal impaction with a lot of stool in the rectosigmoid and some gaseous distention.   ASSESSMENT:  1. Possible  dysphagia, please note atypical barium swallow. The patient is unable to reliably elaborate on his symptoms. 2. Weight loss and failure to thrive, multifactorial.   RECOMMENDATIONS: I have discussed with the patient's caretaker/aunt looking in his esophagus and stomach and first part of the small intestine. She  wants to pursue this.  I have also talked with her about possible placement of a feeding tube to assist with his problems with failure to thrive and weight loss. However, she is declining this at this point as she wants to know what may be going on in the stomach or esophagus prior to making a decision in regards to a possible feeding tube. I have discussed both of these at length with her. Per family wishes and clinical situation, we will pursue EGD initially. I have discussed the risks, benefits, and complications of EGD to include but not be limited to bleeding, infection, perforation, and sedation, and she wishes to proceed.   ____________________________ Christena DeemMartin U. Skulskie, MD mus:cbb D: 09/16/2011 15:09:33 ET T: 09/16/2011 15:22:35 ET JOB#: [id number]  cc: Christena DeemMartin U. Skulskie, MD, <Dictator> Christena DeemMARTIN U SKULSKIE MD ELECTRONICALLY SIGNED 10/01/2011 9:15

## 2014-11-28 NOTE — Consult Note (Signed)
Brief Consult Note: Diagnosis: failure to thrive, possible dysphagia.   Patient was seen by consultant.   Recommend further assessment or treatment.   Comments: Patietn seen and examined, chart reviewed.  Full consult to follow.  Patient presenting with hypernatremia, history of cerebral palsy and diagnosis of dementia, cecal volvulus 2008.  Now with weight loss, dysphagia, FTT.  Barium swallow done about a month ago felt to be anomalous due to positioning rather than actual defect.  Currently would recommend EGD and possibly PEG.  Will order calorie counts, if inadequate, then feeding tube a more prominant option. Of note, patient has a scar on the abdomen c/w previous PEG.  Electronic Signatures for Addendum Section:  Barnetta ChapelSkulskie, Martin (MD) (Signed Addendum 10-Feb-13 15:10)  please see full consult [id number]   Electronic Signatures: Barnetta ChapelSkulskie, Martin (MD)  (Signed 09-Feb-13 20:41)  Authored: Brief Consult Note   Last Updated: 10-Feb-13 15:10 by Barnetta ChapelSkulskie, Martin (MD)

## 2014-11-28 NOTE — Consult Note (Signed)
Chief Complaint:   Subjective/Chief Complaint Further information obtained from family member. Apparently, he is not having dysphagia but more regurgitation. He was severly constipated on admission. Barium swallow is unremarkable.   VITAL SIGNS/ANCILLARY NOTES: **Vital Signs.:   10-Jan-13 10:22   Vital Signs Type Q 4hr   Temperature Temperature (F) 97.6   Celsius 36.4   Temperature Source oral   Pulse Pulse 70   Pulse source per Dinamap   Respirations Respirations 20   Systolic BP Systolic BP 552   Diastolic BP (mmHg) Diastolic BP (mmHg) 70   Mean BP 88   BP Source Dinamap   Pulse Ox % Pulse Ox % 99   Pulse Ox Activity Level  At rest   Oxygen Delivery Room Air/ 21 %   Brief Assessment:   Additional Physical Exam Chronically ill appearing. Abdomen is somewhat distended with hyperactive bowel sounds.   Routine Hem:  10-Jan-13 04:26    WBC (CBC) 6.7   RBC (CBC) 3.56   Hemoglobin (CBC) 11.3   Hematocrit (CBC) 34.6   Platelet Count (CBC) 189   MCV 97   MCH 31.7   MCHC 32.6   RDW 16.5  Routine Chem:  10-Jan-13 04:26    Glucose, Serum 111   BUN 17   Creatinine (comp) 0.84   Sodium, Serum 148   Potassium, Serum 3.9   Chloride, Serum 115   CO2, Serum 24   Calcium (Total), Serum 8.1   Osmolality (calc) 297   eGFR (African American) >60   eGFR (Non-African American) >60   Anion Gap 9  Routine Hem:  10-Jan-13 04:26    Neutrophil % 68.0   Lymphocyte % 20.3   Monocyte % 6.7   Eosinophil % 4.4   Basophil % 0.6   Neutrophil # 4.6   Lymphocyte # 1.4   Monocyte # 0.5   Eosinophil # 0.3   Basophil # 0.0   Assessment/Plan:  Assessment/Plan:   Assessment ? Dysphagia. Further history points more towards regurgitation than actual dysphagia. Barium swallow is unremarkable although a subtle distal esophageal stricture is possible. No gross abnormalities were seen. Severe constipationa dnimpaction may induce early satiety and regurgitation. Abnormal CT neck.    Plan  Continue pureed diet. Will review speech evaluation. Continue stool softners and Miralax. Consider ENT consultations. As patients's overall condition is poor and barium swallow isunremarkable, will hold off of any endoscopy at this time. Continue PPI. Will follow.   Electronic Signatures: Jill Side (MD)  (Signed 10-Jan-13 12:00)  Authored: Chief Complaint, VITAL SIGNS/ANCILLARY NOTES, Brief Assessment, Lab Results, Assessment/Plan   Last Updated: 10-Jan-13 12:00 by Jill Side (MD)

## 2014-11-28 NOTE — Op Note (Signed)
PATIENT NAME:  Troy Cervantes, Kavin K MR#:  [id number] DATE OF BIRTH:  1932-12-05  DATE OF PROCEDURE:  09/20/2011  PREOPERATIVE DIAGNOSIS: Dysphagia.   POSTOPERATIVE DIAGNOSIS: Dysphagia.   OPERATIVE PROCEDURE: Percutaneous endoscopic gastrostomy.   SURGEON: Donnalee CurryJeffrey Kyle Stansell, M.D.  ENDOSCOPIST: Barnetta ChapelMartin Skulskie, M.D.   ANESTHESIA: Monitored anesthesia care under Dr. Maisie Fushomas.   ESTIMATED BLOOD LOSS: Minimal.   CLINICAL NOTE: This 81 year old male has had progressive difficulty with dysphagia and recent upper GI suggested presbyesophagus. He is suspected of having a hiatal hernia. His general medical condition precludes operative repair of the hernia. He was felt by his medical service physicians to be a candidate for percutaneous gastrostomy.   Upper endoscopy was completed by Barnetta ChapelMartin Skulskie, M.D. which will be dictated separately. The patient was found to have an area of the stomach adjacent to the anterior abdominal wall near the midline. The area was prepped with ChloraPrep and draped. 5 mL of 1% plain Xylocaine was infiltrated for analgesia. Under endoscopic guidance, the T-fasteners were passed and the stomach pulled up to the anterior abdominal wall. The gastric then punctured with a needle followed by a guidewire. The area was then gently dilated and an [address] [place] gastrostomy tube placed and seated firmly against the gastric mucosa. This was anchored to the T-fasteners at the 3 cm mark of the skin, 4 cm mark above the T-fasteners. The catheter easily irrigated and aspirated. Scant bleeding was noted. The patient tolerated the procedure well and he was returned to the recovery area in good condition.  ____________________________ Earline MayotteJeffrey W. Zamantha Strebel, MD jwb:ap D: 09/20/2011 22:45:16 ET           T: 09/21/2011 10:29:41 ET                JOB#: [id number] cc: Christena DeemMartin U. Skulskie, MD Sheletha Bow Brion AlimentW Jasraj Lappe MD ELECTRONICALLY SIGNED 09/24/2011 10:52

## 2014-11-28 NOTE — Consult Note (Signed)
Brief Consult Note: Diagnosis: Dyaphagia.   Patient was seen by consultant.   Consult note dictated.   Comments: Patient seen and evaluated. Agree with barium swallow. Consider ENT evaluation due to abnormal neck CT. Will follow.  Electronic Signatures: Lurline DelIftikhar, Lakia Gritton (MD)  (Signed 09-Jan-13 18:42)  Authored: Brief Consult Note   Last Updated: 09-Jan-13 18:42 by Lurline DelIftikhar, Kadijah Shamoon (MD)

## 2014-11-28 NOTE — Consult Note (Signed)
Chief Complaint:   Subjective/Chief Complaint stable, tolerating diet, had bm earlier today.  denies abd pain or nausea.   VITAL SIGNS/ANCILLARY NOTES: **Vital Signs.:   12-Jan-13 09:42   Vital Signs Type Q 4hr   Temperature Temperature (F) 97.9   Celsius 36.6   Temperature Source oral   Pulse Pulse 69   Pulse source per Dinamap   Respirations Respirations 20   Systolic BP Systolic BP 92   Diastolic BP (mmHg) Diastolic BP (mmHg) 62   Mean BP 72   BP Source Dinamap   Pulse Ox % Pulse Ox % 98   Pulse Ox Activity Level  At rest   Oxygen Delivery Room Air/ 21 %  *Intake and Output.:   12-Jan-13 11:45   Stool  pt had a med semi loose stool very soft   Brief Assessment:   Cardiac Regular    Respiratory clear BS    Gastrointestinal details normal Soft  Nontender  Nondistended  No masses palpable  Bowel sounds normal   Radiology Results: XRay:    08-Jan-13 22:38, Soft Tissue Neck   Soft Tissue Neck    REASON FOR EXAM:    difficulty swallowing - family believes there is mass   or lump restricting food a  COMMENTS:       PROCEDURE: DXR - DXR SOFT TISSUE NECK  - Aug 14 2011 10:38PM     RESULT: Anterior cervical fusion changes are seen in the lower cervical   spine. There is a large amount of air within the nasopharynx and   oropharynx. The epiglottis appears normal. The prevertebral soft tissues   are normal. Severe degenerative changes are seen in the cervical spine.    IMPRESSION:      Please see above.    Thank you for the opportunity to contribute to the care of your patient.           Verified By: Elveria Royals, M.D., MD    10-Jan-13 08:15, Barium Swallow Diagnostic   Barium Swallow Diagnostic    REASON FOR EXAM:    not able to toleratee solid food, nausea with eating,   feels like a ball in esoph  COMMENTS:       PROCEDURE: FL  - FL BARIUM SWALLOW  - Aug 16 2011  8:15AM     RESULT: A limited barium swallow examination was performed due to the    patient's body habitus in the risk for aspiration. The patient ingested   small amounts of thin barium through a straw. The cervical esophagus   appears to function well. The thoracic esophagus distended reasonably   well. The 12 mm barium pill passed into the distal esophagus but stuck   here and had to be allowed to dissolve. Surrounding the pill however   there was no evidence of obstruction or of a shelf. On the latter images   of the scan the distal esophagus distends well with gas and there is no   mucosal irregularity at the level of the tablet. The tablet likely had     become sticky due to prolonged contact with the esophageal mucosa in the   associated fluid.    Additional barium was not administered to avoid the possibility of later   fecal impaction. There is considerable gas within the stomach and small   bowel. Is the patient having normal bowel movements?    IMPRESSION:  Allowing for the patient's week physical condition and the   inability to  be in the upright position for this study the function of   the esophagus appears normal. The hangup of the barium pill in the distal   one third of the esophagus is felt to be related to the semirecumbent   position rather than to an intrinsic esophageal abnormality.          Verified By: DAVID A. SwazilandJORDAN, M.D., MD  CT:    09-Jan-13 01:00, CT Neck Without Contrast   CT Neck Without Contrast    REASON FOR EXAM:    abnomal neck xray with possible esophageal diverticula  COMMENTS:       PROCEDURE: CT  - CT NECK WITHOUT CONTRAST  - Aug 15 2011  1:00AM     RESULT: Noncontrast CT of the neck is performed in the standard fashion   with images reconstructed at soft tissue windows at 3.0 mm slice   thickness. The patient has no previous exam for comparison.    The base of the brain shows evidence of atrophy. There is no evidence of   hemorrhage. No large mass or mass effect is evident. The included sinuses   show grossly normal  aeration. The orbits are grossly normal. Nasopharynx,   oropharynx and larynx are distended by air. There is a moderate amount of   air in the upper esophagus. No diverticulum is seen within the proximal   esophagus.No definite mucosal based lesion is identified. Severe     degenerative changes are seen within the cervical spine. Sequela of   previous anterior cervical fusion is demonstrated at the C5 to C7 levels.   There is reversal of the normal cervical lordosis with severe   degenerative change along with the postoperative change in the cervical   spine. The salivary glands appear to be unremarkable for a noncontrast   exam. No adenopathy is demonstrated. Anterolisthesis is noted in the   cervical spineat C2 on C3 with 2-3 mm of anterolisthesis present.   Approximately 5 mm of anterolisthesis of C3 on C4 is present with 4 mm of   anterolisthesis of C5 on C6.    IMPRESSION:   1. Air-filled pharyngeal structures without evidence of esophageal or   tracheal diverticulum.  2. The lung apices show no definite mass or infiltrate. Mild   emphysematous changes are present.  3. Postoperative and severe degenerative changes in the cervical spine.    Thank you for the opportunity to contribute to the careof your patient.           Verified By: Elveria RoyalsGEOFFREY H. BROWNE, M.D., MD   Assessment/Plan:  Assessment/Plan:   Assessment 1) constipation/fecal impaction-currently passing stool, soft abdomen.   2) dysphagia-abnormal barium swallow. position during feeding important per barium swallow.    Plan 1) continue current bowel regimen.  Dr Niel HummerIftikhar to return monday.   Electronic Signatures: Barnetta ChapelSkulskie, Rolla Servidio (MD)  (Signed 12-Jan-13 16:34)  Authored: Chief Complaint, VITAL SIGNS/ANCILLARY NOTES, Brief Assessment, Radiology Results, Assessment/Plan   Last Updated: 12-Jan-13 16:34 by Barnetta ChapelSkulskie, Kenslei Hearty (MD)

## 2014-11-28 NOTE — Consult Note (Signed)
Chief Complaint:   Subjective/Chief Complaint Patient seems to be eating better and have eaten most of his breakfast. Speech evaluation unremarkable. Barium swallow unremarkable except ? distal esophageal spasm. X-rays showed distended bowels consistent with severe constipation and impaction, most likely cause of fullness and regurgitation. Patient had one BM today.  Recommendations: Continue Miralax and stool softners. Dr. Donnella Sham will follow him over the weekend and, if needed, EGD can be done Monday. If patient gets discharged over the weekend, please give him a follow up appointment.   VITAL SIGNS/ANCILLARY NOTES: **Vital Signs.:   11-Jan-13 10:32   Vital Signs Type Q 4hr   Temperature Temperature (F) 98.1   Celsius 36.7   Temperature Source axillary   Pulse Pulse 84   Pulse source per Dinamap   Respirations Respirations 18   Systolic BP Systolic BP 89   Diastolic BP (mmHg) Diastolic BP (mmHg) 44   Mean BP 59   BP Source Dinamap   Pulse Ox % Pulse Ox % 98   Pulse Ox Activity Level  At rest   Oxygen Delivery Room Air/ 21 %   Routine Chem:  11-Jan-13 10:58    Glucose, Serum 87   BUN 11   Creatinine (comp) 0.80   Sodium, Serum 146   Potassium, Serum 3.7   Chloride, Serum 112   CO2, Serum 24   Calcium (Total), Serum 7.9   Osmolality (calc) 289   eGFR (African American) >60   eGFR (Non-African American) >60   Anion Gap 10   Electronic Signatures: Jill Side (MD)  (Signed 11-Jan-13 12:30)  Authored: Chief Complaint, VITAL SIGNS/ANCILLARY NOTES, Lab Results   Last Updated: 11-Jan-13 12:30 by Jill Side (MD)

## 2014-11-28 NOTE — Consult Note (Signed)
Chief Complaint:   Subjective/Chief Complaint patient denies abdominal pain difficult to ascertain detail.   VITAL SIGNS/ANCILLARY NOTES: **Vital Signs.:   10-Feb-13 13:37   Vital Signs Type Routine   Temperature Temperature (F) 98.6   Celsius 37   Temperature Source axillary   Pulse Pulse 84   Pulse source per Dinamap   Respirations Respirations 20   Systolic BP Systolic BP 88   Diastolic BP (mmHg) Diastolic BP (mmHg) 56   Mean BP 66   BP Source Dinamap   Pulse Ox % Pulse Ox % 93   Pulse Ox Activity Level  At rest   Oxygen Delivery Room Air/ 21 %   Brief Assessment:   Cardiac Regular    Respiratory clear BS    Gastrointestinal details normal Soft  Nontender  Nondistended  No masses palpable  Bowel sounds normal   Routine Chem:  10-Feb-13 06:07    Glucose, Serum 81   BUN 16   Creatinine (comp) 0.91   Sodium, Serum 142   Potassium, Serum 4.0   Chloride, Serum 109   CO2, Serum 27   Calcium (Total), Serum 8.1   Osmolality (calc) 283   eGFR (African American) >60   eGFR (Non-African American) >60   Anion Gap 6   Magnesium, Serum 2.2   Assessment/Plan:  Assessment/Plan:   Assessment 1) dysphagia, failure to thrive.  EGD planned for tomorrow.  I have discussed the risks benefits and complications of egd to include not limited to bleeding infection perforation and sedation with patients caretaker/aunt and she wishes to proceed.  She does not wish to pursue PEG at this time, but may consider in the future.    Plan as above.   Electronic Signatures: Loistine Simas (MD)  (Signed 10-Feb-13 14:57)  Authored: Chief Complaint, VITAL SIGNS/ANCILLARY NOTES, Brief Assessment, Lab Results, Assessment/Plan   Last Updated: 10-Feb-13 14:57 by Loistine Simas (MD)

## 2014-11-28 NOTE — Consult Note (Signed)
History of Present Illness:   Reason for Consult Lytic area on the CT scan (left superior iliac bone )    HPI 81 year old gentleman who is bedbound and quadriplegic with multiple contractures.  Does not communicate well.  Most of the history obtained by reviewing records.  Was admitted in the hospital with hyponatremia and dehydration.  Patient has a previous history of cerebral palsy.  During evaluation a CT scan of abdomen revealed a lytic area in the pelvic bone and I was asked to evaluate patient and at that point in time.  Patient has been reported to have significant weight loss.  He was hypernatremic.   PFSH:   Comments No family history of colorectal cancer, breast cancer, or ovarian cancer.    Comments Does not smoke does not drink patient is bed bound    Additional Past Medical and Surgical History Cerebral palsy  Cervical disc disease with myelopathy  Functionally quadriplegic  Previous history of small bowel obstruction and surgery  Dementia  COPD  Multiple hospital admission with poor oral intakeAnd dehydration  Previous history of pituitary tumor surgery   Review of Systems:   Review of Systems Patient is difficult to comprehend and most of the history obtained from reviewing the records.  No history of pain.  Patient is bed bound.  Decrease oral intake.  Multiple admission due to poor intake and dehydration. No bony pains reported Review of all remaining 12 systems have  been reported to be noncontributory.   NURSING NOTES: **Vital Signs.:   09-Jan-13 11:30    Vital Signs Type: Admission    Temperature Temperature (F): 97.6    Celsius: 36.4    Temperature Source: oral    Pulse Pulse: 80    Pulse source: per Dinamap    Respirations Respirations: 20    Systolic BP Systolic BP: 450    Diastolic BP (mmHg) Diastolic BP (mmHg): 79    Mean BP: 90    BP Source: Dinamap    Pulse Ox % Pulse Ox %: 100    Pulse Ox Activity Level: At rest    Oxygen Delivery:  Room Air/ 21 %   Physical Exam:   Physical Exam Patient is lying in the bed.  Multiple contractures.  Not in any acute distress.  Cachectic. LYMPHATICS:   No cervical, axillary, or inguinal lymphadenopathy Lungs: Emphysematous.  Coarse crepitation at the right base ABDOMINAL:   Positive bowel sounds, nontender, nondistended, soft.  No hepatomegaly, no splenomegaly Lower extremity no edema  Neurological system: Multiple contractures difficult to evaluate CARDIOVASCULAR:   Regular rate and rhythm. S1 and S2 without murmur, gallop or rub.  Examination of the remaining 12 systems is noncontributory.     hypokalemia:    HTN:    Cerebral Palsy:    Other, see comments: cecectomy   Neck Surgery:    Codeine: Itching, Rash  Tylenol: Unknown  Gabapentin: Unknown  IVP Dye: Unknown    promethazine 12.5 mg oral tablet: 1 tab(s) orally every 6 hours as needed- for nausea., Active, 0, None   diazepam 5 mg oral tablet: 1 tab(s) orally once a day as needed, Active, 0, None   omeprazole 20 mg oral delayed release capsule: 1 cap(s) orally once a day for acid reducer, Active, 0, None   Celebrex 100 mg oral capsule: 1 cap(s) orally 2 times a day as needed for ankle pain., Active, 0, None   Vitamin B-12 5040mcg tablet: 1 tab(s) orally once a day (in the  evening) only., Active, 0, None   iron 54m tablet: 1 tab(s) orally once a day (in the morning) only., Active, 0, None   hydrOXYzine hydrochloride 25 mg oral tablet: 1-2 tab(s) orally as needed- for itching or insomnia., Active, 0, None   baclofen 10 mg oral tablet: 1 tab(s) orally every 8 hours as needed for muscle spasms., Active, 0, None   atropine-diphenoxylate 0.025 mg-2.5 mg oral tablet: 2 tab(s) orally as needed for loose stools., Active, 0, None   triamcinolone 0.1% topical cream: apply to affected areas topically as needed after bath for rash., Active, 0, None   donepezil 10 mg oral tablet: 1 tab(s) orally once a day (at bedtime)  for alzheimer. **brand name aricept**, Active, 0, None   megestrol 625 mg/5 mL oral suspension: 16 milliliter(s) orally once a day, Active, 0, None   nystatin 100,000 units/mL oral suspension: take as directed orally once a day for thrash. **shake well**, Active, 0, None  Assessment and Plan:   Impression 1ct  scan does reveal a lytic area in the pelvic bone. Exact etiology is not known  Would rule out any evidence of prostate cancer or multiple myeloma  Considering overall patient's condition it would be very difficult to pursue any aggressive workup.   Agreed with the palliative care evaluation.   Electronic Signatures: CJobe Gibbon(MD)  (Signed 10-Jan-13 07:55)  Authored: HISTORY OF PRESENT ILLNESS, PFSH, ROS, NURSING NOTES, PE, PAST MEDICAL HISTORY, ALLERGIES, HOME MEDICATIONS, ASSESSMENT AND PLAN   Last Updated: 10-Jan-13 07:55 by CJobe Gibbon(MD)

## 2014-11-28 NOTE — Consult Note (Signed)
PATIENT NAME:  Troy Cervantes, Troy Cervantes MR#:  [id number] DATE OF BIRTH:  [birth year]  DATE OF CONSULTATION:  08/17/2011  REFERRING PHYSICIAN:  Fredia SorrowAbhinav Gupta, MD  CONSULTING PHYSICIAN:  Suszanne ConnersMichael R. Evelene CroonWolff, MD  REASON FOR CONSULTATION: Urinary incontinence.   HISTORY OF PRESENT ILLNESS: Mr. Troy Cervantes is a 81 year old African American male who was brought to the hospital due to failure to thrive, and decreased food and liquid intake, as well as decreased urinary output. His niece who cares for him states that he is incontinent of urine and wears diapers. She states that she has noticed that he strains sometimes when he voids. The patient is unable to give me a urological history.   ALLERGIES: Codeine, gabapentin, intravenous pyelogram dye and Tylenol.   CHRONIC MEDICATIONS: Phenergan, Prilosec, diazepam, Celebrex, iron, B12, hydroxyzine, baclofen, Lomotil, Aricept and Megace.   PAST SURGICAL HISTORY:  1. Removal of pituitary tumor.  2. Small bowel obstruction.   SOCIAL HISTORY: Noncontributory.   FAMILY HISTORY: Noncontributory.   PAST AND CURRENT MEDICAL CONDITIONS:  1. Functional quadriplegia as a result of a pituitary tumor surgery.  2. Cerebral palsy.  3. C-spine disk myelopathy.  4. Decubitus ulcer.  5. Pituitary tumor.  6. Alzheimer's dementia. 7. Gastroesophageal reflux disease. 8. Chronic obstructive pulmonary disease.    PHYSICAL EXAMINATION:  GENITOURINARY/RECTAL:   No palpable rectal masses. Prostate gland was not palpable.   LABORATORY, DIAGNOSTIC AND RADIOLOGICAL DATA:  CT scan report dated January 9th indicated normal kidneys.  PSA was less than 0.1 ng/mL on January 10th.  BUN was 11, and creatinine was 0.8 on January 11th.    IMPRESSION:  1. Urinary incontinence.  2. Possible straining to void.  3. Prostatic atrophy versus absence.   PLAN:  1. I discussed with the patient's niece that options would be condom catheter, intermittent clean catheterization t.i.d. versus  indwelling Foley.  2. The patient is not a candidate for urological surgical intervention to correct incontinence at this time.    ____________________________ Suszanne ConnersMichael R. Evelene CroonWolff, MD mrw:[id number] D: 08/17/2011 16:48:31 ET T: 08/17/2011 17:42:38 ET JOB#: [id number]  cc: Suszanne ConnersMichael R. Evelene CroonWolff, MD, <Dictator> Orson ApeMICHAEL R Alizza Sacra MD ELECTRONICALLY SIGNED 08/20/2011 7:35

## 2014-11-28 NOTE — Discharge Summary (Signed)
PATIENT NAME:  Troy Cervantes, Zeph K MR#:  [id number] DATE OF BIRTH:  [birth year]  DATE OF ADMISSION:  08/15/2011 DATE OF DISCHARGE:  08/19/2011  PRESENTING COMPLAINT: Poor p.o. intake and decreased urine output.   DISCHARGE DIAGNOSES:  1. Hypernatremia secondary to severe dehydration, improved, resolved.  2. Dysphagia, improved.  3. Gastroesophageal reflux disease.   4. Cerebral palsy. The patient is bedbound.  5. Constipation, resolved.  CONDITION ON DISCHARGE: Fair. Vitals stable.   MEDICATIONS:  1. Promethazine 12.5 mg every six hours as needed.  2. Diazepam 5 mg orally daily as needed.  3. Celebrex 100 mg b.i.d. as needed.  4. Iron 27 mg in the morning daily.  5. Hydroxyzine 25 mg 1 to 2 orally as needed for itching or insomnia.  6. Baclofen 10 mg every eight hours as needed for muscle spasm.  7. Lomotil 2 tablets as needed for loose stool.  8. Triamcinolone 0.1% topical cream on affected areas as needed.  9. Donepezil 10 mg p.o. daily at bedtime.  10. MiraLAX 17 grams in 8 ounces water daily.  11. Dulcolax 10 mg over-the-counter as needed.  12. Docusate 100 mg b.i.d.  13. Protonix 40 mg daily.  14. Vitamin B12 500 mcg p.o. daily.   REFERRALS: Resume home services as before.   FOLLOW-UP:  1. Follow-[place] Dr. Malachi [place] in 1 to 2 weeks.  2. The patient's niece was given numbers for Dr. Lorre NickGittin and/or Dr. Doylene Canninghoksi to [facility] if they wish to proceed with further work-up for his left hip lytic lesions.  3. Follow-up with Dr. Niel HummerIftikhar of Gastroenterology as needed.   LABS AT DISCHARGE: Basic metabolic panel within normal limits. Iron binding capacity 161. Ferritin 204. PSA less than 0.1.   Barium swallow, diagnostic, esophagus normal. Hang up of the barium pill in the distal one-third of the esophagus is felt to be related to the semirecumbent position rather than intrinsic esophageal abnormality.   Hemoglobin and hematocrit 11.3 and 34.6.  White count 6.7. Serum protein electrophoresis shows low total protein at 5.4. Albumin 2.8. SPEP appears to be within normal limits.   CT of the neck without contrast shows air-filled pharyngeal structures without evidence of esophageal or tracheal diverticulum. Lung apices show no mass or infiltrate. Postoperative and severe degenerative changes in the cervical spine.   CT of the abdomen and pelvis shows lytic-appearing lesion in the left superior iliac region. Findings consistent with fecal impaction. Fluid versus incomplete descent of testicles in the inguinal canals. Bronchiectasis in the lung bases especially in the right lower lobe.   Urinalysis negative for urinary tract infection. Serum sodium on admission was 152. EKG is normal sinus rhythm.   CONSULTATIONS:  1. Oncology, Dr. Lorre NickGittin and Dr. Doylene Canninghoksi   2. GI, Dr. Niel HummerIftikhar and Dr. Marva PandaSkulskie   3. Palliative Care, Dr. Harvie JuniorPhifer   4. Urology, Dr. Anola GurneyMichael Wolff    CODE STATUS: NO CODE, DO NOT RESUSCITATE.   BRIEF SUMMARY OF HOSPITAL COURSE: Mr. Troy Cervantes is a 81 year old African American gentleman who has history of cerebral palsy along with dementia, chronic obstructive pulmonary disease, history of cecal volvulus status post resection, and gastroesophageal reflux disease who was admitted with:  1. Failure to thrive with hypernatremia suspected due to decreased p.o. intake. The patient's sodium was 152 on admission. He was started on IV fluids, hydrated well enough for his sodium to be normalized to 142 at discharge.  2. Dysphagia. The patient's niece did have some issues regarding  patient having symptoms of dysphagia. He was seen by speech therapist and put on pureed diet. He was also seen by GI and since patient was tolerating diet and his swallow study was essentially negative, EGD was held off. He was started on Protonix for possible GERD symptoms that could cause some of the symptoms of dysphagia. The patient did tolerate it well.   3. Lytic hip  lytic lesion. He was seen by Oncology. SPEP and PSA are normal. No aggressive work-up at present. The patient's niece is given the number to the Cancer Center if she desires to follow-up at a later date.  4. History of fecal impaction. Daily stool softeners were given along with lactulose. The patient did have a good bowel movement prior to discharge.  5. History of cerebral palsy with bedbound functional quadriplegia. Air mattress was ordered.  6. Gastroesophageal reflux disease. PPI was continued.  7. Urinary incontinence. The patient was seen by Dr. Evelene Croon. He recommended deends/ diapers versus in and out cath and/or chronic indwelling Foley catheter. The patient currently is only on Depends and is doing well.   The patient will be discharged to home with niece. Home services will be resumed. He remained a NO CODE, DO NOT RESUSCITATE.   TIME SPENT: 40 minutes.   ____________________________ Wylie Hail Allena Katz, MD sap:drc D: 08/19/2011 13:34:10 ET T: 08/19/2011 14:19:01 ET JOB#: [id number]  cc: Miara Emminger A. Allena Katz, MD, <Dictator> Rosanne Gutting, MD Willow Ora MD ELECTRONICALLY SIGNED 08/21/2011 15:24

## 2014-11-28 NOTE — Discharge Summary (Signed)
PATIENT NAME:  Troy Cervantes, Troy Cervantes MR#:  [id number] DATE OF BIRTH:  1932/11/03  DATE OF ADMISSION:  09/14/2011 DATE OF DISCHARGE:  09/21/2011  DISCHARGE DIAGNOSES:  1. Hyponatremia due to dehydration and poor oral intake.  2. Hypertension.  3. Failure to thrive.  4. Malnutrition.  5. Dysphagia due to tortuous esophagus and esophageal dysmotility, status post PEG tube placement.  6. Cerebral palsy/dementia. 7. Left hip lytic lesion.  8. Constipation. 9. Anemia.  10. Hypokalemia.  11. Hypomagnesemia.  12. Chronic obstructive pulmonary disease.  13. History of cecal volvulus status post resection.  14. Degenerative joint disease.  15. Pituitary tumor.  16. Urinary incontinence.   DISPOSITION: The patient is being discharged home with Home Health.  DIET:  Puree. Tube feeds Jevity 1.5, 280 mL q.i.d. 30-mL free water flush before and after feeding, 200-mL free water flushes t.i.d.   ACTIVITY: As tolerated.   DISCHARGE FOLLOWUP: Follow up with primary care physician Dr. Malachi Pro 1 to 2 weeks after discharge.   DISCHARGE MEDICATIONS:  1. Oxycodone 5 mg/5 mL, 5 mg q. 6 hours p.r.n. severe pain.  2. Promethazine 12.5 mg q. 6 hours p.r.n. nausea. 3. Diazepam 5 mg daily p.r.n.  4. Celebrex 100 mg b.i.d. p.r.n. 5. Iron 27 mg daily.  6. Baclofen 10 mg q. 8 hours. 7. Atropine-diphenoxylate 0.025/2.5 mg, 2 tablets p.r.n. for loose stools.  8. Triamcinolone 0.1%, apply to affected area topically as needed.  9. Aricept 10 mg daily.  10. Hydroxyzine 25 mg p.r.n. daily as needed for itching or insomnia.  11. MiraLAX 17 mg daily p.r.n. for constipation.  12. Dulcolax 5 mg 2 tablets once a day.  13. Colace 100 mg b.i.d.  14. Protonix 40 mg daily.  15. Cyanocobalamin 500 mcg daily.  16. The patient's POA has been advised to hold the Dulcolax and docusate if the patient is having loose stools or diarrhea.   CONSULTATIONS:  1. GI consultation by Dr. Marva Panda. 2. Palliative care consultation  with Dr. Harvie Junior.  CODE STATUS: DO NOT RESUSCITATE.   LABORATORY, DIAGNOSTIC, AND RADIOLOGICAL DATA: Abdominal three-way x-ray showed considerable stool in the rectosigmoid area. Microbiology: Blood cultures no growth so far. White count normal, hemoglobin ranging from 12.6 to 9.9. Platelet count normal.  Sodium 155 on admission, 138 by the time of discharge.  Potassium 3.4, supplemented. Magnesium 1.5, supplemented.  AST 67. The rest of the LFTs are normal.   HOSPITAL COURSE: The patient is a 81 year old male with multiple medical problems with multiple recent admissions who presented with dehydration and poor oral intake and hyponatremia. The patient's hyponatremia was felt to be due to poor oral intake and resolved with IV fluid hydration. He also had other electrolyte abnormalities including low potassium and magnesium, which were supplemented. His blood pressure was borderline low on admission possibly due to dehydration, which improved with IV fluids. He also had severe malnutrition with albumin of 2.8 and continued dysphagia. A GI consultation with Dr. Marva Panda was obtained and the patient underwent an endoscopy on 08/17/2011 which showed tortuous esophagus any esophageal dysmotility in addition to hiatal hernia. The patient's dysphagia in view of these findings was unlikely to improve. The patient's  POA wanted PEG tube placement for maintenance of nutritional status. Dr. Marva Panda in conjunction with Dr. Lemar Livings placed a PEG tube in the patient. The patient has been started on PEG tube feedings. The patient's family was educated on administering bolus tube feeds. The patient's family was desirous of being discharged home as soon  as possible. Therefore, arrangements were made for Home Health for ongoing health and education at home for the patient's family.   TIME SPENT: 45 minutes.    ____________________________ Darrick MeigsSangeeta Jamyiah Labella, MD sp:bjt D: 09/21/2011 16:27:49 ET T: 09/22/2011 15:14:25  ET JOB#: [id number]  cc: Darrick MeigsSangeeta Christian Borgerding, MD, <Dictator> Rosanne GuttingSarah C. Ringel, MD Darrick MeigsSANGEETA Ellaree Gear MD ELECTRONICALLY SIGNED 09/25/2011 11:27

## 2014-11-28 NOTE — Consult Note (Signed)
Chief Complaint:   Subjective/Chief Complaint No acute complaints   VITAL SIGNS/ANCILLARY NOTES: **Vital Signs.:   12-Jan-13 14:03   Vital Signs Type Routine   Temperature Temperature (F) 97.6   Celsius 36.4   Temperature Source oral   Pulse Pulse 67   Pulse source per Dinamap   Respirations Respirations 18   Systolic BP Systolic BP 96   Diastolic BP (mmHg) Diastolic BP (mmHg) 64   Mean BP 74   BP Source Dinamap   Pulse Ox % Pulse Ox % 97   Pulse Ox Activity Level  At rest   Oxygen Delivery Room Air/ 21 %   Brief Assessment:   Cardiac Regular    Respiratory normal resp effort    Gastrointestinal details normal Soft  Nontender   Routine Hem:  10-Jan-13 04:26    WBC (CBC) 6.7   RBC (CBC) 3.56   Hemoglobin (CBC) 11.3   Hematocrit (CBC) 34.6   Platelet Count (CBC) 189   MCV 97   MCH 31.7   MCHC 32.6   RDW 16.5  Routine Chem:  10-Jan-13 04:26    Glucose, Serum 111   BUN 17   Creatinine (comp) 0.84   Sodium, Serum 148   Potassium, Serum 3.9   Chloride, Serum 115   CO2, Serum 24   Calcium (Total), Serum 8.1   Osmolality (calc) 297   eGFR (African American) >60   eGFR (Non-African American) >60   Anion Gap 9  Routine Hem:  10-Jan-13 04:26    Neutrophil % 68.0   Lymphocyte % 20.3   Monocyte % 6.7   Eosinophil % 4.4   Basophil % 0.6   Neutrophil # 4.6   Lymphocyte # 1.4   Monocyte # 0.5   Eosinophil # 0.3   Basophil # 0.0  Oncology:  10-Jan-13 07:02    Prostate Specific Ag, Roche ECLIA <0.1  Routine Chem:  11-Jan-13 10:58    Glucose, Serum 87   BUN 11   Creatinine (comp) 0.80   Sodium, Serum 146   Potassium, Serum 3.7   Chloride, Serum 112   CO2, Serum 24   Calcium (Total), Serum 7.9   Osmolality (calc) 289   eGFR (African American) >60   eGFR (Non-African American) >60   Anion Gap 10   Assessment/Plan:  Assessment/Plan:   Assessment SEE ALSO INITIAL ONCOLOGY CONSULT AND PALLIATIVE CARE CONSULT.  TODAY STABLE  NO ACUTE COMPLAINTS.  SPEP  NEG SIEP AND UIEP PENDING. PSA NORMAL. POA WANTS FURTHUR W/U.  THIS COULD BE A LESION OF LIGHT CHAIN MYELOMA BUT UNLIKELY. THIS COULD BE METASTATIC CANCER FROM OCCULT PRIMARY. POA DID NOT WANT PET/CT SCAN. TWE HAVE ALREADY SCANNED NON CONTRAST NECK, ABDO, AND PELVIS, PLUS CXR.     PLAN  CHECK SIEP, UIEP, CEA, SERUM FREE LIGHT CHAINS. WITH BORDERLINE ANEMIA CHECK IRON STUDIES. F/U IN CANCER CENTER. I WILL REVIEW SCANS,  CONSIDER YIELD OF CHEST CT. CONSIDER LATER REPEAT CT OF ILLIAC BONE. ALSO WILL WANT GI F/U FOR DYSPHAGIA.   OK TO MAKE F/U APPT IN CANCER CENTER IN APPROX 10 DAYS   Electronic Signatures: Dallas Schimke (MD)  (Signed 12-Jan-13 14:33)  Authored: Chief Complaint, VITAL SIGNS/ANCILLARY NOTES, Brief Assessment, Lab Results, Assessment/Plan   Last Updated: 12-Jan-13 14:33 by Dallas Schimke (MD)

## 2014-11-28 NOTE — H&P (Signed)
PATIENT NAME:  Troy Cervantes, BROSKY MR#:  [id number] DATE OF BIRTH:  [birth year]  DATE OF ADMISSION:  09/14/2011  REFERRING PHYSICIAN: Glennie Isle, MD PRIMARY CARE PHYSICIAN:  Dr. Malachi Pro in [place].   HEMATOLOGIST: Dr. Doylene Canning.   GASTROENTEROLOGIST: Dr. Niel Hummer.   CHIEF COMPLAINT: Diminished p.o. intake and dehydration.   HISTORY OF PRESENT ILLNESS: The patient is a pleasant 81 year old African American male with past medical history of cerebral palsy, cervical disk disease, myelopathy, functional quadriplegia, sacral decubitus ulcer, pituitary tumor status post surgery x2, Alzheimer dementia, gastroesophageal reflux disease, chronic obstructive pulmonary disease, multiple admissions for dehydration including November of 2012 and January of 2013 who presents now with diminished p.o. intake and dehydration per the patient's niece, [name]. The patient was recently admitted to [facility] from 08/15/2011 to 08/19/2011. At that point in time, he presented with poor p.o. intake and decreased urine output. He was found to have severe hypernatremia, dysphagia, and gastroesophageal reflux disease. He was seen by multiple consultants. He was seen by Dr. Lorre Nick and Dr. Doylene Canning for left hip lytic lesion. He was seen by Dr. Niel Hummer and Dr Marva Panda for dysphagia and poor p.o. intake. He was also evaluated by Palliative Care by Dr. Harvie Junior. He was also seen by Dr. Evelene Croon for urinary incontinence.   With conservative therapy the patient's serum sodium improved during the last hospitalization. In regards to dysphagia as above, he was seen by Gastroenterology and with conservative management his p.o. intake improved. EGD was held at that point in time. He now presents with the similar symptoms from his last admission. The patient's niece [name] states to me that he was doing well for about 10 days at home, however then developed anorexia and has not been drinking fluids very well  for the past two weeks. She attempted to give him liquid nutrition with Instant Carnation; however, the patient did not take this willingly. He presents now with a very high sodium of 155.  Serum potassium is also a bit low at 3.4. The patient's niece reports that he continues to complain of intermittent dysphagia. He was to see Dr. Niel Hummer on 09/20/2011. He also had followup with Dr. Doylene Canning on 10/04/2011.   PAST MEDICAL HISTORY:  1. Cerebral palsy.  2. Alzheimer dementia.  3. Chronic obstructive pulmonary disease.  4. History of tobacco abuse.  5. 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  6. Degenerative disk disease, status post C5-7 fusion with history of cervical myelopathy.  7. Pituitary tumor resection x2.  8. GERD.    9. Left hip lytic lesion followed by Dr. Doylene Canning.  10. Urinary incontinence evaluated by Dr. Evelene Croon.  11. Recurrent hypernatremia.  12. History of dysphasia followed by Dr. Niel Hummer.   ALLERGIES: Codeine, gabapentin, IV dye, and Tylenol.   DISCHARGE MEDICATIONS FROM August 19, 2011 INCLUDE:  1. Phenergan 12.5 mg p.o. q.6 hours p.r.n.  2. Diazepam 5 mg p.o. daily as needed.   3. Celebrex 100 mg p.o. b.i.d. 4. Iron supplements.  5. Hydroxyzine 25 mg p.o. daily p.r.n.  6. Baclofen 10 mg p.o. q.8 p.r.n.  7. Lomotil 2 tablets p.o. as needed for loose stool.  8. Triamcinolone 0.1% topical cream on affected areas as needed.  9. Donepezil 10 mg p.o. at bedtime.  10. MiraLAX 17 grams in 8 ounces of water daily.  11. Dulcolax 10 mg over the counter as needed.  12. Docusate 100 mg p.o. b.i.d.  13. Protonix  40 mg daily.  14. Vitamin B12 at 500 mcg p.o. daily   SOCIAL HISTORY: The patient lives at home with his niece. The patient's niece provides much of his care. However, home health is also in place and is there 8 hours during the week and 4 hours during the weekend. No reported current tobacco abuse though he has a history of  tobacco abuse. No alcohol or illicit drug use.   FAMILY HISTORY: No significant medical problems in the family as per prior records.   REVIEW OF SYSTEMS: Currently unobtainable from the patient.   PHYSICAL EXAMINATION:  VITAL SIGNS: Temperature 97.6, pulse 112, respirations 18, blood pressure 94/70.   GENERAL: General physical examination reveals a cachectic-appearing African American male currently in no acute distress.   HEENT: Normocephalic, atraumatic. Extraocular movements were noted spontaneously. Pupils equal, round, and reactive to light. No scleral icterus. Conjunctivae are pink. No epistaxis noted. Difficult to assess his hearing. Oral mucosa is quite dry. Native dentition is poor.   NECK: Supple without JVD or lymphadenopathy.   LUNGS: Scattered rhonchi with normal respiratory effort.   HEART: S1, S2, regular rate and rhythm. No murmurs, rubs, or gallops appreciated.   ABDOMEN: Soft, nontender, nondistended. Bowel sounds positive. No rebound or guarding. No gross organomegaly appreciated.   EXTREMITIES: No clubbing, cyanosis, or edema noted.   NEUROLOGIC: Patient is awake and arousable. Difficult to comprehend his speech at present. The patient has a contracture of the right upper extremity.   SKIN: Warm and dry. Diminished skin turgor noted.   GU: No suprapubic tenderness is noted at this time.     LABORATORY DATA: Sodium 155, potassium 3.4, chloride 116, CO2 of 29, BUN 23, creatinine 1.08, glucose 111. CBC shows WBC 10.1, hemoglobin 12.6, hematocrit 38, platelets 203. Troponin of less than 0.02. LFTs show total protein 7.6, albumin 2.8, total bilirubin 0.5, alkaline phosphatase 109, AST 67, ALT 59, troponin of less than 0.02.   IMPRESSION: This is a 81 year old African American male with a past medical history of cerebral palsy, dementia, chronic obstructive pulmonary disease, history of tobacco abuse, cecal volvulus status post resection of the cecum and terminal ileum in  June 2008, degenerative disk disease status post C5-7 cervical spine fusion, pituitary tumor status post resection x2, gastroesophageal reflux disease, left hip lytic lesion, urinary incontinence, recent dysphagia, weight loss, failure to thrive, multiple admissions for dehydration who presented to [facility] with recurrent dehydration, ongoing anorexia, and dysphagia.   PROBLEM LIST:  1. Severe hypernatremia with sodium of 155.  2. Hypotension.  3. Failure to thrive.  4. Malnutrition with albumin of 2.8.  5. Cerebral palsy.  6. Alzheimer dementia.  7. Left hip lytic lesion.  8. Continued dysphagia.  9. History of pituitary tumor.  10. Sacral decubitus ulcer.  11. Functional quadriplegia.   PLAN: The patient presents very similarly as he did in November of 2012 and January of 2013. After his discharge from the hospital the last time he initially did well for about 10 days; however, he then had a relapse of his anorexia and dysphagia per the patient's niece. He has not had much to eat over the past two weeks. He was to have followup appointment with Dr. Niel HummerIftikhar.  With conservative management at the last visit, his dysphagia and anorexia had improved. Therefore, EGD was held at that time. We will reconsult GI to reevaluate him for EGD now. To treat his hypernatremia we will start the patient on D5W at 125  mL per hour. We will continue to monitor serum sodium over time. The patient was on Protonix at the last visit, and we will restart this in the IV form. We will also obtain abdominal x-ray series for further evaluation. We will hold off on further imaging studies until Gastroenterology has had a chance to evaluate him. In addition to this, we will obtain another consult with Palliative Care. If an organic cause for his dysphagia and anorexia is not found, feeding tube may need to be considered. If the patient is not a candidate for this, palliative and Hospice care may need to be  pursued further as well.  The plan of care was discussed in depth with the patient's niece, Ms Ayvion Kavanagh. Further plan to be determined over the course of the hospitalization.   ____________________________ Lennox Pippins, MD mnl:vtd D: 09/14/2011 18:05:57 ET T: 09/14/2011 19:25:58 ET  JOB#: [id number] cc: Lennox Pippins, MD, <Dictator>   Rosanne Gutting, MD Lennox Pippins MD ELECTRONICALLY SIGNED 09/14/2011 20:55

## 2014-11-28 NOTE — Consult Note (Signed)
Chief Complaint:   Subjective/Chief Complaint at bseline, no change.   VITAL SIGNS/ANCILLARY NOTES: **Vital Signs.:   12-Feb-13 14:19   Temperature Temperature (F) 98.7   Celsius 37   Temperature Source axillary   Pulse Pulse 92   Respirations Respirations 20   Systolic BP Systolic BP 95   Diastolic BP (mmHg) Diastolic BP (mmHg) 58   Mean BP 70   Pulse Ox % Pulse Ox % 98   Pulse Ox Activity Level  At rest   Oxygen Delivery Room Air/ 21 %   Brief Assessment:   Cardiac Regular    Respiratory clear BS    Gastrointestinal details normal Soft  Nontender  Nondistended  No masses palpable  Bowel sounds normal   Routine Chem:  12-Feb-13 08:52    Glucose, Serum 82   BUN 10   Creatinine (comp) 0.73   Sodium, Serum 138   Potassium, Serum 4.7   Chloride, Serum 103   CO2, Serum 25   Calcium (Total), Serum 8.4   Osmolality (calc) 274   eGFR (African American) >60   eGFR (Non-African American) >60   Anion Gap 10   Assessment/Plan:  Assessment/Plan:   Assessment 1)  dysphagia-marked presbyesophagus, possible paraesophageal hernia 2) failure to thrive.  patient family now desirous of getting a feeding tube placed. patient with some weight loss, poor po intake.    Plan 1) recommend peg.  I will need to coordinate with surgery.  coordination will be difficult as outpatient, recommend holding patient for proceedure.   Electronic Signatures: Loistine Simas (MD)  (Signed 12-Feb-13 21:28)  Authored: Chief Complaint, VITAL SIGNS/ANCILLARY NOTES, Brief Assessment, Lab Results, Assessment/Plan   Last Updated: 12-Feb-13 21:28 by Loistine Simas (MD)

## 2014-11-28 NOTE — H&P (Signed)
PATIENT NAME:  Troy Cervantes, Troy Cervantes MR#:  [id number] DATE OF BIRTH:  1933/07/13  DATE OF ADMISSION:  08/15/2011  PRIMARY CARE PHYSICIAN: Dr. Malachi ProSarah Ringel in [place]   PSYCHIATRIST: Dr. Broadus JohnWarren   History was mainly obtained from the power-of-attorney, the niece, Roselyn BeringBrenda Blowe. The patient is difficult to comprehend. The patient was brought into the Emergency Room at 1 p.m. on 08/14/2011 for decreased p.o. intake and decreased urinary output.   HISTORY OF PRESENT ILLNESS: This is a 81 year old male who is bedbound, functionally quadriplegic. He has history of cerebral palsy, history of pituitary tumor status post surgery. He has cervical disk disease with myelopathy and history of small bowel obstruction in the past. He has decubitus ulcer in the sacral area. He was last admitted on 06/30/2011 and discharged on 07/02/2011. He also has history of Alzheimer's dementia and chronic obstructive pulmonary disease. He was admitted under similar circumstances for generalized lethargy, decreased p.o. intake, hypertension, and hypernatremia. The niece actually takes care of him. He also has an aide that comes to the house 8 hours a day. He actually stopped eating about two weeks ago. The niece says that he is drinking fluids. He is able to drink milk and he is able to drink water but he is not able to eat. He says he feels nauseous when he eats. He also feels like salt coming to his throat, like a ball coming to his throat that's choking him. He was about 156 pounds a few months ago and in the Emergency Room he is down to 120 pounds. He is also having decreased urinary output though he is drinking fluids. The patient was taken to Dr. Raymond Gurneyingel's office at [facility] on 08/10/2011 and got labs done. As per the niece, the labs were fine at Dr. Raymond Gurneyingel's office but the niece was concerned that he is not eating and he's having decreased urinary output. He is lethargic. No episode of vomiting. As per the  niece, he is also having bowel movements at home. In the Emergency Room, he was found to have hypernatremia with a sodium of 152 and a BUN of 23. He got two liters of normal saline bolus in the Emergency Room. He had extensive radiological imaging done in the Emergency Room. He had a chest x-ray done which showed multiple air-filled loops of bowel which are incompletely evaluated. KUB showed possible fecal impaction. X-ray of the neck showed a large amount of air within the nasopharynx and oropharynx. He had a CT of the neck and CT of the abdomen and pelvis done. CT of the abdomen and pelvis was suggestive of findings consistent with fecal impaction. No definite obstruction or perforation. Lytic appearing lesion in the left superior iliac region and bronchiectasis in the lung bases. CT of the neck showed air-filled pharyngeal structures without evidence of esophageal or tracheal diverticulum. The patient is being admitted for hypernatremic dehydration, failure to thrive, poor p.o. intake, oliguria, and declining health.   The niece says that his blood pressure was running low. His blood pressure has been running in the range of 99 systolic. He usually runs 120's to 112 systolic.   REVIEW OF SYSTEMS: His review of systems is limited as the patient is difficult to comprehend. The niece is actually trying to comprehend him. Review of systems is positive for weakness. Positive for weight loss. No fever. No headache. No cough. No dyspnea. No chest pain. He is complaining of nausea with eating and mainly complaining of this salt  or poor taste in his mouth along with a ball that comes along the esophagus to his mouth but he denies any heartburn. Also positive for oliguria. No diarrhea. No other review of systems is possible at this time.   PAST MEDICAL HISTORY:  1. Cerebral palsy. 2. Cervical disk disease with myelopathy. 3. He is basically bedbound with functional quadriplegic. 4. Decubitus ulcer.  5. History  of small bowel obstruction, previous surgery. 6. History of pituitary tumor, status post surgery x2 as per the niece.   7. Alzheimer's dementia.  8. Gastroesophageal reflux disease.  9. Chronic obstructive pulmonary disease.  10. Last admitted 06/30/2011 and discharged on 07/02/2011 with poor p.o. intake, dehydration, and hypernatremia.   PAST SURGICAL HISTORY: As per the niece, he had pituitary tumor surgery from the transnasal route, he had small bowel obstruction surgery   ALLERGIES TO MEDICATIONS: Codeine, gabapentin, IVP dye, and Tylenol.   MEDICATIONS: His medication list was reviewed with the niece and includes:   1. Promethazine 12.5 mg q.6 hours as needed for nausea.  2. Prilosec 40 mg daily.  3. Diazepam 5 mg once a day as needed. 4. Celebrex twice a day as needed.  5. Iron once daily.  6. B12 500 mcg daily. 7. Hydroxyzine 25 mg p.r.n. for itching. 8. Baclofen 10 mg q.8 hours as needed for muscle spasm.  9. Lomotil as needed for loose stool.  10. Aricept at bedtime 10 mg. 11. Megace once daily.   SOCIAL HISTORY: He lives at home and his niece takes care of him. They have an aide who comes for 8 hours a day. No smoking or alcohol use.   FAMILY HISTORY: No significant medical problems in the family as per prior records.   PHYSICAL EXAMINATION:  VITAL SIGNS: When he came to the Emergency Room, temperature 96.9, heart rate 66, respiratory rate 18, blood pressure 96/87, saturating 97% on room air. His latest vitals include heart rate 90, respiratory rate 20, blood pressure 107/84, saturating 98% on room air.   GENERAL: This is an elderly African American male who is cachectic, wasted appearance, bedbound, functionally quadriplegic in an emaciated state.   HEENT: Bilateral pupils are equal. No scleral icterus. No conjunctivitis. Oral mucosa is dry. Mild pallor.   NECK: No thyroid tenderness, enlargement, or nodule. No masses, nontender. No adenopathy. No JVD. No carotid  bruits. He has history of significant cervical disk disease.   CHEST: Bilateral breath sounds are diminished. No wheezing. Normal effort. Not using accessory muscles of respiration.   HEART: Heart sounds are regular. Peripheral pulses may be 1+.   ABDOMEN: Abdomen appears to be soft. No tenderness appreciated. No masses. No hepatosplenomegaly. No bruit. The bowel sounds are on the hyperactive side. He is having bowel movements. He had a bowel movement while I was interviewing him.   NEUROLOGIC: He is awake. His speech is incomprehensible. He has contractures of his right hand. He has significant muscle wasting. He has contractures of his hands and feet. He has only limited movement of his left upper extremity, otherwise he cannot move his other extremities.   SKIN: He has a healing decubitus ulcer, sacral decubitus ulcer, that appears to have healed.   LABORATORY, DIAGNOSTIC, AND RADIOLOGICAL DATA: White count 7.4, hemoglobin 12.1, platelet count 206,000. BMP sodium 152, potassium 3.7, BUN 23, creatinine 0.9, AST 43, albumin 3. Troponin negative. Urinalysis essentially negative, clear. ABG shows pH 7.38, pCO2 40, pO2 90 on room air. His EKG shows that he has a sinus  rhythm, low voltage QRS complexes, nonspecific T wave changes. As previously dictated, CT of the neck with contrast shows some air-filled pharyngeal structures without evidence of esophageal or tracheal diverticulum. He had emphysematous changes. CT of abdomen and pelvis shows fecal impaction and he has a lytic lesion in the left superior iliac region.   IMPRESSION:  1. Hypernatremic dehydration with poor p.o. intake. 2. Hypotension secondary to dehydration causing oliguria. 3. Failure to thrive. 4. Severely malnourished. 5. History of cervical disk disease with myelopathy. 6. Cerebral palsy. 7. Alzheimer's dementia.  8. History of pituitary tumor. 9. Decubitus ulcer. 10. Functional quadriplegic.  11. Left iliac lytic lesion.    PLAN: This is a 81 year old male who is basically bedbound, functionally quadriplegic, with history of cervical disk disease with myelopathy. He has pituitary tumor, cerebral palsy, and some mild dementia who comes in with hypertension, poor p.o. intake, and poor urinary output. He is dehydrated. He has hypernatremia. I'm going to start him on some D5 half normal saline. Will watch his sodium levels. He has been drinking fluids but he is not able to eat any solids. He is on a PPI already at home. Will start him on IV PPI. Will get a GI consult on him. I will order a barium swallow on him considering the fact that he feels like some ball is in his esophagus area that is choking him. He also feels salt in his mouth. Will also get a barium swallow. Will also get a speech evaluation on him. He had a CT of the abdomen and pelvis done in the Emergency Room that showed that he has fecal impaction. ER physician tried to disimpact him and he had multiple bowel movements in the Emergency Room. He has no evidence of obstruction on the CT scan. He also had a lytic lesion on the left iliac bone. I will get an Oncology consult and order an SPEP on him.   I discussed with the niece, his healthcare power-of-attorney, about his declining state, multiple medical problems, and poor condition that he is in. Will call a Palliative Care consult on him. I also discussed CODE STATUS with the patient in front of the healthcare power-of-attorney. The patient wants to be DO NOT RESUSCITATE. He understands that. The healthcare power-of-attorney agrees with that.  TIME SPENT WITH ADMISSION AND COORDINATION OF CARE: 55 minutes.    ____________________________ Fredia Sorrow, MD ag:drc D: 08/15/2011 10:01:02 ET T: 08/15/2011 12:08:25 ET JOB#: [id number]  cc: Fredia Sorrow, MD, <Dictator> Rosanne Gutting, MD Fredia Sorrow MD ELECTRONICALLY SIGNED 08/31/2011 10:46

## 2014-11-28 NOTE — Consult Note (Signed)
Chief Complaint:   Subjective/Chief Complaint patient at baseline.  no co, denies abd pain or nausea.   VITAL SIGNS/ANCILLARY NOTES: **Vital Signs.:   13-Feb-13 10:52   Vital Signs Type Q 4hr   Temperature Temperature (F) 97.3   Celsius 36.2   Pulse Pulse 76   Pulse source per Dinamap   Respirations Respirations 17   Systolic BP Systolic BP 78   Diastolic BP (mmHg) Diastolic BP (mmHg) 48   Mean BP 58   BP Source manual   Pulse Ox % Pulse Ox % 98   Pulse Ox Activity Level  At rest   Oxygen Delivery Room Air/ 21 %  *Intake and Output.:   13-Feb-13 09:45   Stool  medium sized liquid grey/green stool incontinent    14:55   Stool  medium sized dark green stool incontinent   Brief Assessment:   Cardiac Regular    Respiratory clear BS    Gastrointestinal details normal Soft  Nontender  Nondistended  No masses palpable  Bowel sounds normal   Routine Hem:  13-Feb-13 05:02    Platelet Count (CBC) 235   Assessment/Plan:  Assessment/Plan:   Assessment 1) failure to thrive and weight loss.   2) severe presbyesophagus, probable paraesophageal hernia on EGD.   3) multiple medical problems previously noted.    Plan 1) for egd/peg tomorrow.  I have discussed with Dr Lemar LivingsByrnett.  I have discussed the risks benefits and complications of egd/peg to include not limited to bleeding infection perofration and sedation and his caretaker/neice wishes to proceed.   Electronic Signatures: Barnetta ChapelSkulskie, Kylea Berrong (MD)  (Signed 13-Feb-13 15:33)  Authored: Chief Complaint, VITAL SIGNS/ANCILLARY NOTES, Brief Assessment, Lab Results, Assessment/Plan   Last Updated: 13-Feb-13 15:33 by Barnetta ChapelSkulskie, Derelle Cockrell (MD)

## 2014-11-28 NOTE — Consult Note (Signed)
Chief Complaint:   Subjective/Chief Complaint stable denies abdominal pain.   VITAL SIGNS/ANCILLARY NOTES: **Vital Signs.:   11-Feb-13 13:56   Temperature Temperature (F) 98.5   Celsius 36.9   Temperature Source axillary   Pulse Pulse 88   Respirations Respirations 20   Systolic BP Systolic BP 093   Diastolic BP (mmHg) Diastolic BP (mmHg) 61   Mean BP 86   Pulse Ox % Pulse Ox % 95   Pulse Ox Activity Level  At rest   Oxygen Delivery Room Air/ 21 %  *Intake and Output.:   11-Feb-13 10:41   Stool  large   soft  unformed   Brief Assessment:   Cardiac Regular    Respiratory clear BS    Gastrointestinal details normal Soft  Nontender  Nondistended  No masses palpable  Bowel sounds normal   Routine Chem:  09-Feb-13 05:57    Glucose, Serum 123   BUN 23   Creatinine (comp) 1.02   Sodium, Serum 151   Potassium, Serum 3.9   Chloride, Serum 115   CO2, Serum 30   Calcium (Total), Serum 7.7  Hepatic:  09-Feb-13 05:57    Bilirubin, Total 0.4   Alkaline Phosphatase 85   SGPT (ALT) 40   SGOT (AST) 46   Total Protein, Serum 5.8   Albumin, Serum 2.0  Routine Chem:  09-Feb-13 05:57    Osmolality (calc) 305   eGFR (African American) >60   eGFR (Non-African American) >60   Anion Gap 6  Routine Hem:  09-Feb-13 05:57    WBC (CBC) 8.1   RBC (CBC) 2.90   Hemoglobin (CBC) 9.9   Hematocrit (CBC) 30.1   Platelet Count (CBC) 177   MCV 104   MCH 34.2   MCHC 33.0   RDW 17.5   Neutrophil % 73.6   Lymphocyte % 12.7   Monocyte % 12.0   Eosinophil % 1.2   Basophil % 0.5   Neutrophil # 5.9   Lymphocyte # 1.0   Monocyte # 1.0   Eosinophil # 0.1   Basophil # 0.0  Routine Coag:  09-Feb-13 05:57    Prothrombin 15.9   INR 1.2  Routine Chem:  09-Feb-13 05:57    Magnesium, Serum 1.5  10-Feb-13 06:07    Glucose, Serum 81   BUN 16   Creatinine (comp) 0.91   Sodium, Serum 142   Potassium, Serum 4.0   Chloride, Serum 109   CO2, Serum 27   Calcium (Total), Serum 8.1    Osmolality (calc) 283   eGFR (African American) >60   eGFR (Non-African American) >60   Anion Gap 6   Magnesium, Serum 2.2   Assessment/Plan:  Assessment/Plan:   Assessment 1) dysphagia, failure to thrive, weight loss.    Plan 1) EGD today. I have discussed the risks benefits and complications of egd to include not limited to bleeding infection perforation and sedation and caretatke/neice wishes to proceed.  She did not at this point wish to proceed with peg. further recs to follow.   Electronic Signatures: Loistine Simas (MD)  (Signed 11-Feb-13 15:02)  Authored: Chief Complaint, VITAL SIGNS/ANCILLARY NOTES, Brief Assessment, Lab Results, Assessment/Plan   Last Updated: 11-Feb-13 15:02 by Loistine Simas (MD)

## 2014-11-28 NOTE — Consult Note (Signed)
Brief Consult Note: Diagnosis: Urinary incontinence.   Patient was seen by consultant.   Consult note dictated.   Discussed with Attending MD.   Comments: Discussed options of condom catheter, clean intermittant cath tid, vs. indwelling Foley. The patient's power of attorney would like some time to think about these options.The prostate is palpabally absent or extremely atrophic because of Megace and it is unlikely to be contributing to the incontinence.  Electronic Signatures: Orson ApeWolff, Sherrell Weir R (MD)  (Signed 11-Jan-13 16:52)  Authored: Brief Consult Note   Last Updated: 11-Jan-13 16:52 by Orson ApeWolff, Obrian Bulson R (MD)

## 2014-11-28 NOTE — Consult Note (Signed)
Chief Complaint:   Subjective/Chief Complaint patient at baseline, denies abdominal pain.   VITAL SIGNS/ANCILLARY NOTES: **Vital Signs.:   14-Feb-13 11:13   Vital Signs Type Q 4hr   Temperature Temperature (F) 97.9   Celsius 36.6   Temperature Source axillary   Pulse Pulse 87   Pulse source per Dinamap   Respirations Respirations 18   Systolic BP Systolic BP 95   Diastolic BP (mmHg) Diastolic BP (mmHg) 56   Mean BP 69   BP Source Dinamap   Pulse Ox % Pulse Ox % 97   Pulse Ox Activity Level  At rest   Oxygen Delivery Room Air/ 21 %  *Intake and Output.:   14-Feb-13 11:13   Stool  large loose stool   Brief Assessment:   Cardiac Regular    Respiratory clear BS    Gastrointestinal details normal Soft  Nontender  Bowel sounds normal  No rebound tenderness   Routine Coag:  09-Feb-13 05:57    INR 1.2  Routine Hem:  13-Feb-13 05:02    Platelet Count (CBC) 235   Assessment/Plan:  Assessment/Plan:   Assessment 1) dyspnagia, failure to thrive, nausea, weight loss.  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    Plan 1) patient for PEG today.  I have discussed the risks benefits and complications of egd/peg to include not limited to bleeding infection perforation and sedation and he and caretaker/neice with to proceed.  Further recs to follow.   Electronic Signatures: Barnetta ChapelSkulskie, Niylah Hassan (MD)  (Signed [phone]-Feb-13 12:23)  Authored: Chief Complaint, VITAL SIGNS/ANCILLARY NOTES, Brief Assessment, Lab Results, Assessment/Plan   Last Updated: 14-Feb-13 12:23 by Barnetta ChapelSkulskie, Kechia Yahnke (MD)

## 2014-11-28 NOTE — Consult Note (Signed)
PATIENT NAME:  Troy Cervantes, Troy Cervantes MR#:  [id number] DATE OF BIRTH:  11-Nov-1932  DATE OF CONSULTATION:  08/15/2011  REFERRING PHYSICIAN:  Dr. Chales AbrahamsGupta  CONSULTING PHYSICIAN:  Lurline DelShaukat Marwa Fuhrman, MD  REASON FOR CONSULTATION: Dysphagia.   HISTORY OF PRESENT ILLNESS: 81 year old male who is bedbound, history of cerebral palsy, history of pituitary tumor, history of cervical disk disease with myopathy and history of small bowel obstruction in the past. Patient has multiple other medical issues as well including dementia and chronic obstructive pulmonary disease. He was admitted with generalized lethargy, dehydration and poor oral intake. Apparently patient has not been eating well for the last several weeks and is basically drinking either milk or liquids but no solid food. He was thought to be dehydrated and has lost a significant amount of weight. No history is available from the patient, although he does seem to understand my question and answered them in simple yes and no. He was hypernatremic on admission. KUB showed possible fecal impaction and he was just disimpacted in the Emergency Room. X-ray of the neck showed a large amount of air within the nasopharynx and oropharynx. CT scan of the neck also reported air filled cavity without any diverticula. CT of the abdomen and pelvis was suggestive of fecal impaction as mentioned above.   PAST MEDICAL HISTORY:  1. Positive for cerebral palsy. 2. History of cervical disk disease.  3. History of decubitus ulcers.  4. Small bowel obstruction. 5. Pituitary tumor.  6. Alzheimer's dementia.  7. Gastroesophageal reflux disease.  8. Chronic obstructive pulmonary disease.   ALLERGIES: Codeine, gabapentin, IVP dye and Tylenol.   HOME MEDICATIONS:  1. Promethazine. 2. Prilosec. 3. Diazepam. 4. Celebrex. 5. Iron. 6. B12. 7. Hydroxyzine. 8. Baclofen. 9. Lomotil. 10. Aricept. 11. Megace.   SOCIAL HISTORY: Lives at home and his niece takes care of him.    FAMILY HISTORY: Not available.   REVIEW OF SYSTEMS: Not available.   PHYSICAL EXAMINATION:  GENERAL: Cachectic male, chronically ill appearing in poor overall health. Several missing teeth was noted.  VITAL SIGNS: Temperature 97.6, respirations 20, blood pressure 114/79, pulse 80.    NEUROLOGICAL: He is basically quadriplegic on neurological examination.   LUNGS: Fair air entry bilaterally.   CARDIOVASCULAR: Regular rate and rhythm.   ABDOMEN: High-pitched, frequent bowel sounds. Abdomen is somewhat distended and tympanitic on percussion. No hepatosplenomegaly or ascites were noted.   EXTREMITIES: Without edema.   LABORATORY, DIAGNOSTIC, AND RADIOLOGICAL DATA: White cell count 7.4, hemoglobin 12.1, hematocrit 37.5, platelet count 206, troponin 0.03. Electrolytes are unremarkable now. BUN 23 with a creatinine of 0.9. Liver enzymes are normal. CT scan of abdomen, pelvis and neck as mentioned above.   ASSESSMENT AND PLAN: Patient with dysphagia, poor tolerance to solids especially. According to Dr. Urban GibsonGupta's history and physical patient's niece also reported patient complaining of sensation of a ball in the back of her throat. CT scan of neck is quite abnormal. Dr. Chales AbrahamsGupta is planning to obtain a barium swallow with which I agree. I would also recommend an ENT consultation for further evaluation of abnormalities seen on the CT scan. Further recommendations to follow depending on the results of the ENT evaluation and barium swallow.   Thank you so much Dr. Chales AbrahamsGupta. Will follow with you.   ____________________________ Lurline DelShaukat Anyelin Mogle, MD si:cms D: 08/15/2011 18:49:06 ET T: 08/16/2011 09:59:01 ET JOB#: [id number]  cc: Lurline DelShaukat Toba Claudio, MD, <Dictator>  Lurline DelSHAUKAT Antoinette Haskett MD ELECTRONICALLY SIGNED 08/20/2011 11:12
